# Patient Record
Sex: Female | Born: 1950
Health system: Southern US, Community
[De-identification: ages and names within clinical notes are randomized; demographics above are authoritative.]

## PROBLEM LIST (undated history)

## (undated) DIAGNOSIS — F419 Anxiety disorder, unspecified: Secondary | ICD-10-CM

## (undated) DIAGNOSIS — Z8669 Personal history of other diseases of the nervous system and sense organs: Secondary | ICD-10-CM

## (undated) DIAGNOSIS — R011 Cardiac murmur, unspecified: Secondary | ICD-10-CM

## (undated) DIAGNOSIS — H269 Unspecified cataract: Secondary | ICD-10-CM

## (undated) HISTORY — DX: Personal history of other diseases of the nervous system and sense organs: Z86.69

## (undated) HISTORY — PX: TUBAL LIGATION: SHX77

## (undated) HISTORY — PX: CATARACT EXTRACTION: SUR2

## (undated) HISTORY — DX: Unspecified cataract: H26.9

## (undated) HISTORY — DX: Cardiac murmur, unspecified: R01.1

## (undated) HISTORY — PX: WISDOM TOOTH EXTRACTION: SHX21

## (undated) HISTORY — DX: Anxiety disorder, unspecified: F41.9

## (undated) HISTORY — PX: OTHER SURGICAL HISTORY: SHX169

---

## 1997-12-02 ENCOUNTER — Ambulatory Visit (HOSPITAL_COMMUNITY): Admission: RE | Admit: 1997-12-02 | Discharge: 1997-12-02 | Payer: Self-pay | Admitting: Neurological Surgery

## 1999-11-30 ENCOUNTER — Encounter: Admission: RE | Admit: 1999-11-30 | Discharge: 1999-11-30 | Payer: Self-pay | Admitting: Family Medicine

## 1999-11-30 ENCOUNTER — Encounter: Payer: Self-pay | Admitting: Family Medicine

## 2001-04-25 ENCOUNTER — Encounter: Payer: Self-pay | Admitting: Neurological Surgery

## 2001-04-25 ENCOUNTER — Encounter: Admission: RE | Admit: 2001-04-25 | Discharge: 2001-04-25 | Payer: Self-pay | Admitting: Neurological Surgery

## 2001-07-17 ENCOUNTER — Encounter: Payer: Self-pay | Admitting: Family Medicine

## 2001-07-17 ENCOUNTER — Encounter: Admission: RE | Admit: 2001-07-17 | Discharge: 2001-07-17 | Payer: Self-pay | Admitting: Family Medicine

## 2002-10-15 ENCOUNTER — Encounter: Admission: RE | Admit: 2002-10-15 | Discharge: 2002-10-15 | Payer: Self-pay | Admitting: Family Medicine

## 2002-10-15 ENCOUNTER — Encounter: Payer: Self-pay | Admitting: Family Medicine

## 2004-02-24 ENCOUNTER — Encounter: Admission: RE | Admit: 2004-02-24 | Discharge: 2004-02-24 | Payer: Self-pay | Admitting: Family Medicine

## 2004-03-30 ENCOUNTER — Encounter: Admission: RE | Admit: 2004-03-30 | Discharge: 2004-03-30 | Payer: Self-pay | Admitting: Family Medicine

## 2005-06-28 ENCOUNTER — Encounter: Admission: RE | Admit: 2005-06-28 | Discharge: 2005-06-28 | Payer: Self-pay | Admitting: Family Medicine

## 2007-08-30 ENCOUNTER — Encounter: Admission: RE | Admit: 2007-08-30 | Discharge: 2007-08-30 | Payer: Self-pay | Admitting: Family

## 2008-10-29 ENCOUNTER — Encounter: Admission: RE | Admit: 2008-10-29 | Discharge: 2008-10-29 | Payer: Self-pay | Admitting: Family Medicine

## 2009-09-06 IMAGING — MG MM SCREEN MAMMOGRAM BILATERAL
5 series · 5 of 5 positions shown · non-contrast
Comparison: none

DG SCREEN MAMMOGRAM BILATERAL
Bilateral CC and MLO view(s) were taken.

DIGITAL SCREENING MAMMOGRAM WITH CAD:
There are scattered fibroglandular densities.  No masses or malignant type calcifications are 
identified.  Compared with prior studies.

[R CC (1 of 2)]
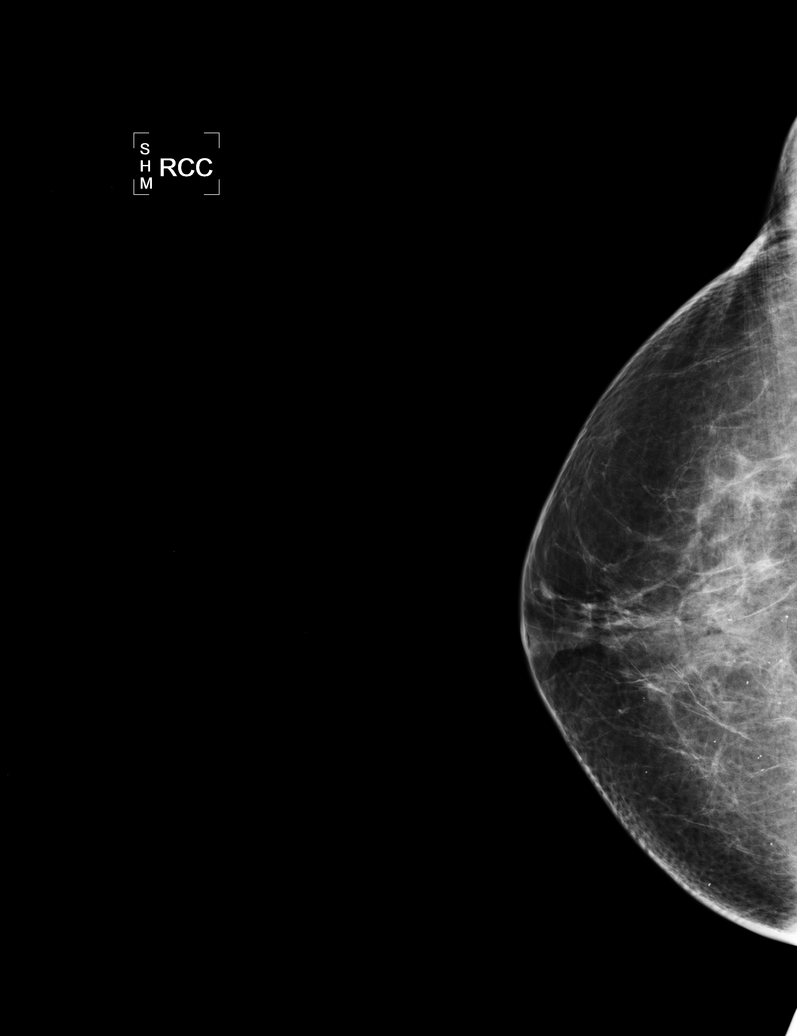

[L CC]
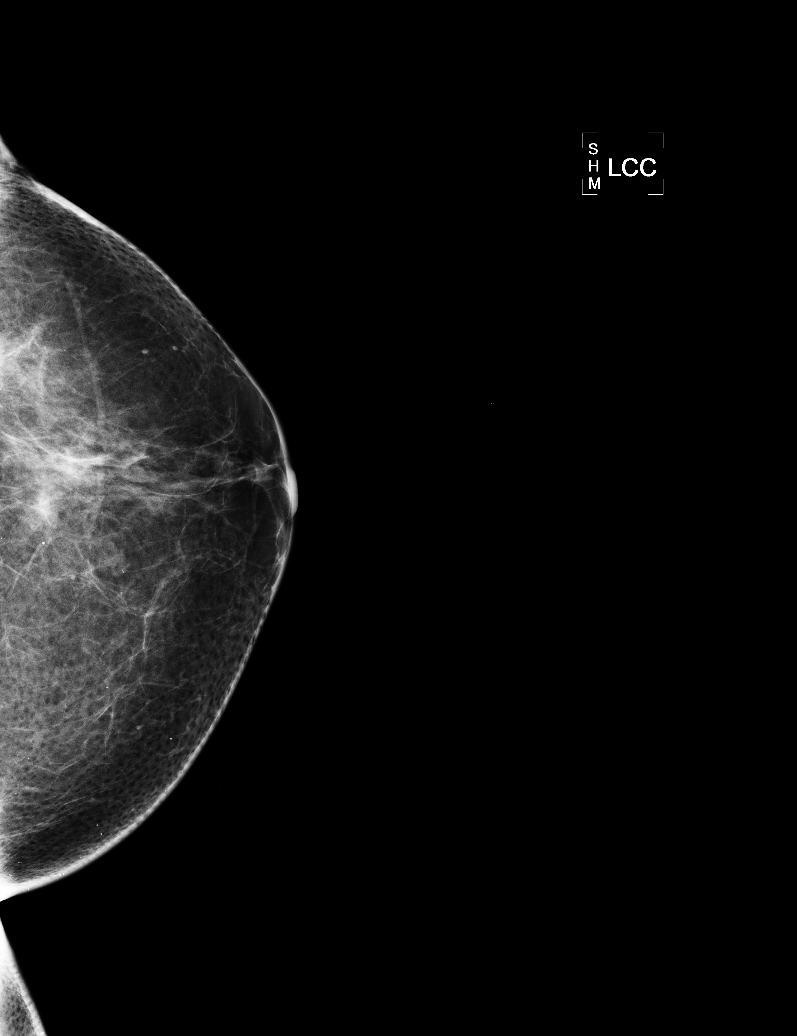

[L MLO]
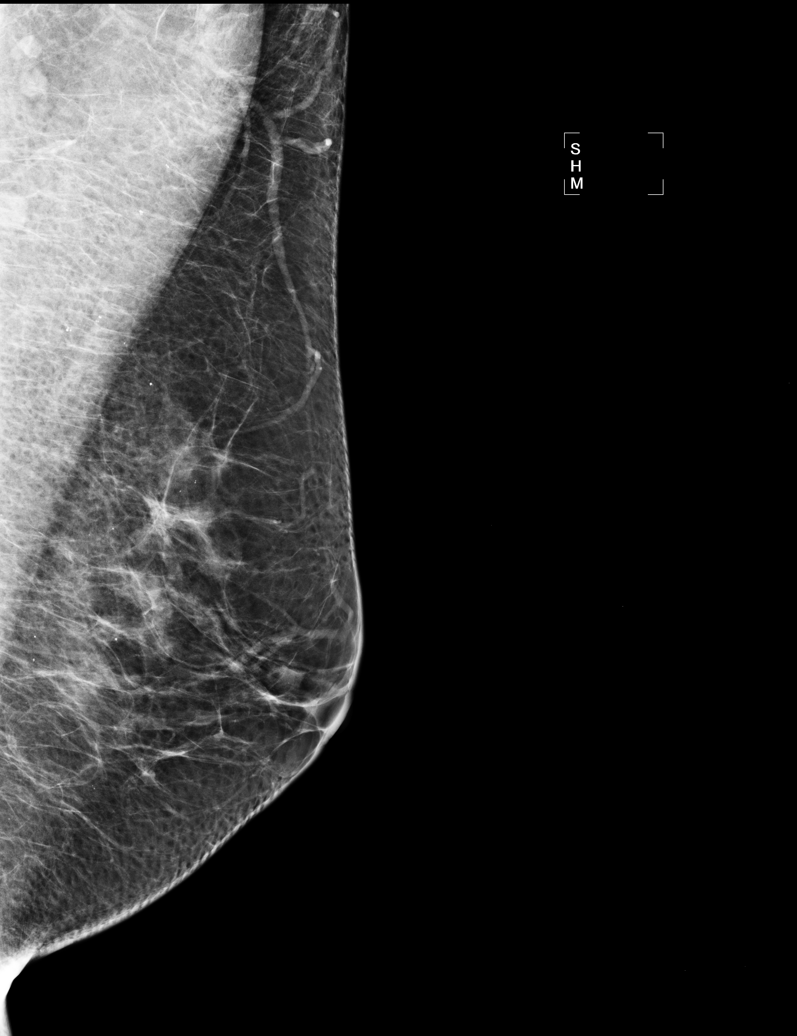

[R MLO]
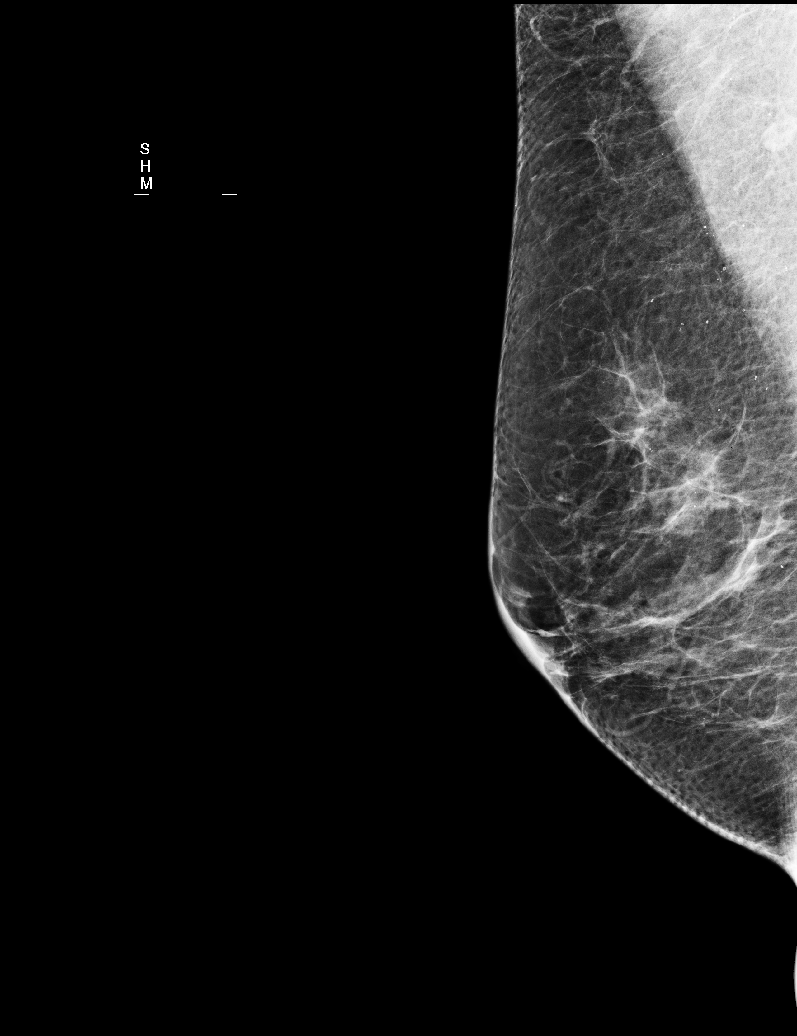

[R CC (2 of 2)]
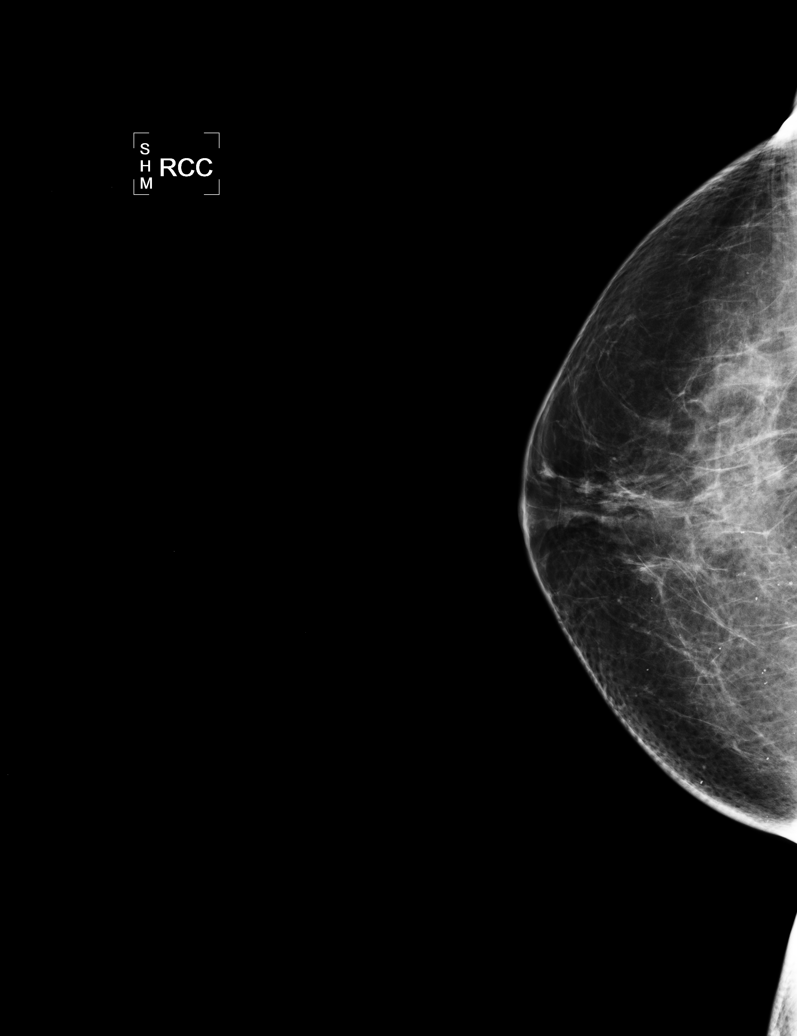

[5 of 5 positions shown; findings below may reference images not displayed]

IMPRESSION: No specific mammographic evidence of malignancy.  Next screening mammogram is recommended in one 
year.

ASSESSMENT: Negative - BI-RADS 1

Screening mammogram in 1 year.
ANALYZED BY COMPUTER AIDED DETECTION. , THIS PROCEDURE WAS A DIGITAL MAMMOGRAM.

## 2009-11-04 ENCOUNTER — Encounter: Admission: RE | Admit: 2009-11-04 | Discharge: 2009-11-04 | Payer: Self-pay | Admitting: Family Medicine

## 2010-10-04 ENCOUNTER — Other Ambulatory Visit: Payer: Self-pay | Admitting: Obstetrics & Gynecology

## 2010-10-04 DIAGNOSIS — Z1231 Encounter for screening mammogram for malignant neoplasm of breast: Secondary | ICD-10-CM

## 2010-11-06 IMAGING — MG MM SCREEN MAMMOGRAM BILATERAL
4 series · 4 of 4 positions shown · non-contrast
Comparison: none

DG SCREEN MAMMOGRAM BILATERAL
Bilateral CC and MLO view(s) were taken.

DIGITAL SCREENING MAMMOGRAM WITH CAD:
There are scattered fibroglandular densities.  No masses or malignant type calcifications are 
identified.  Compared with prior studies.

[R CC]
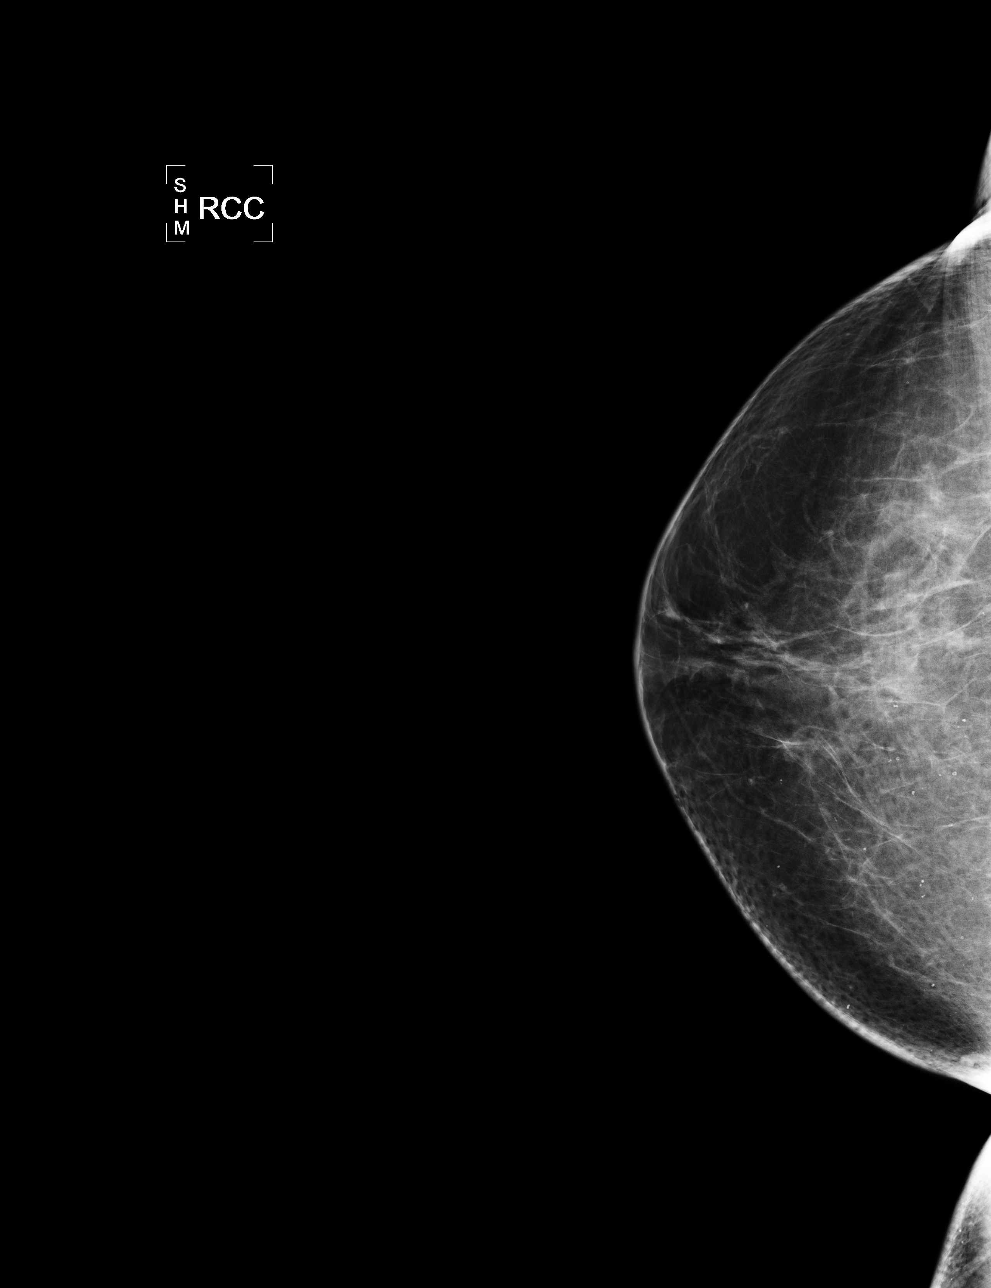

[L CC]
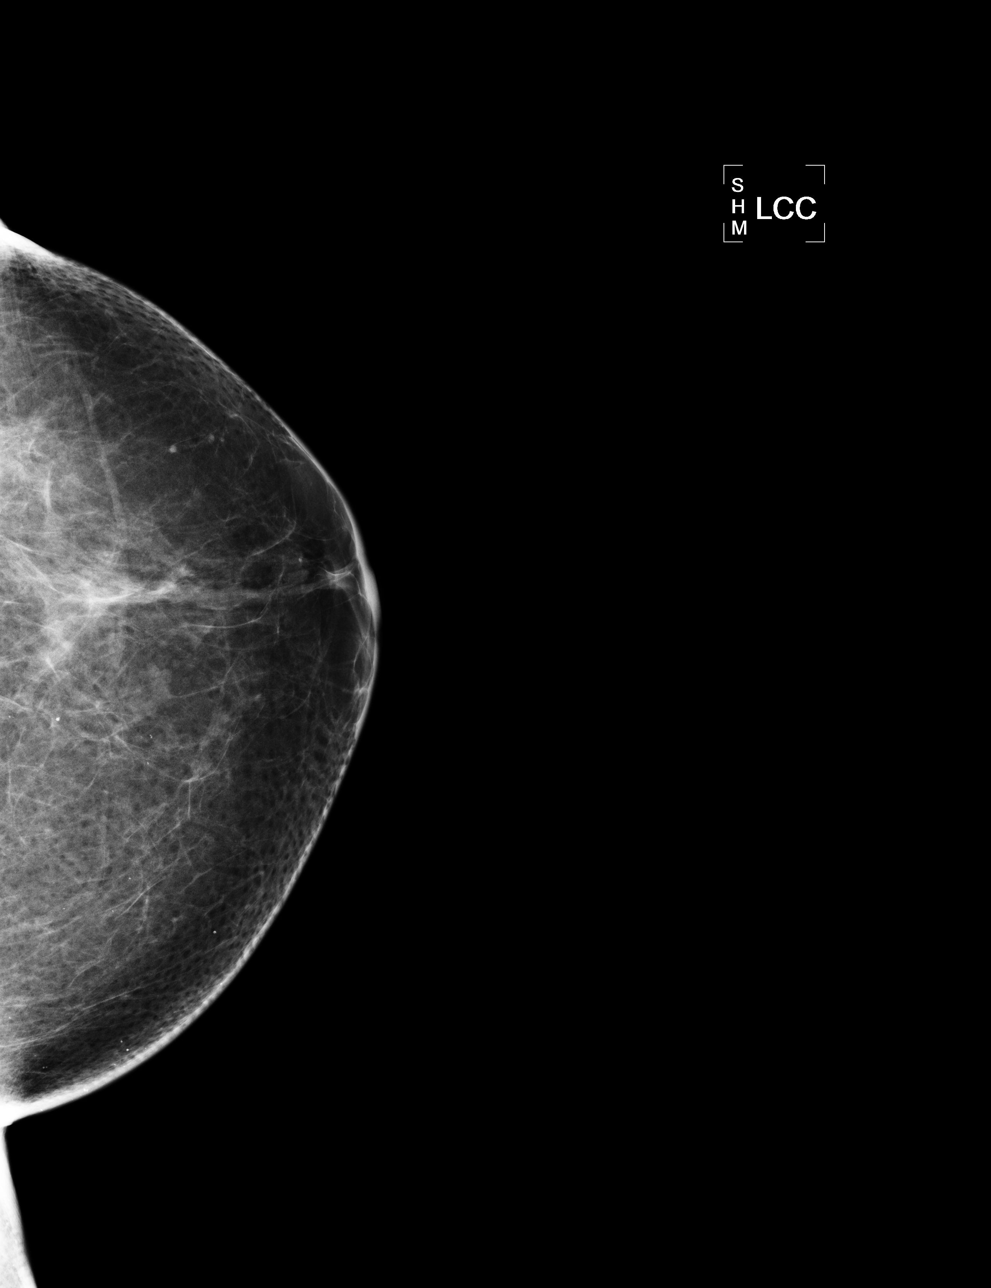

[L MLO]
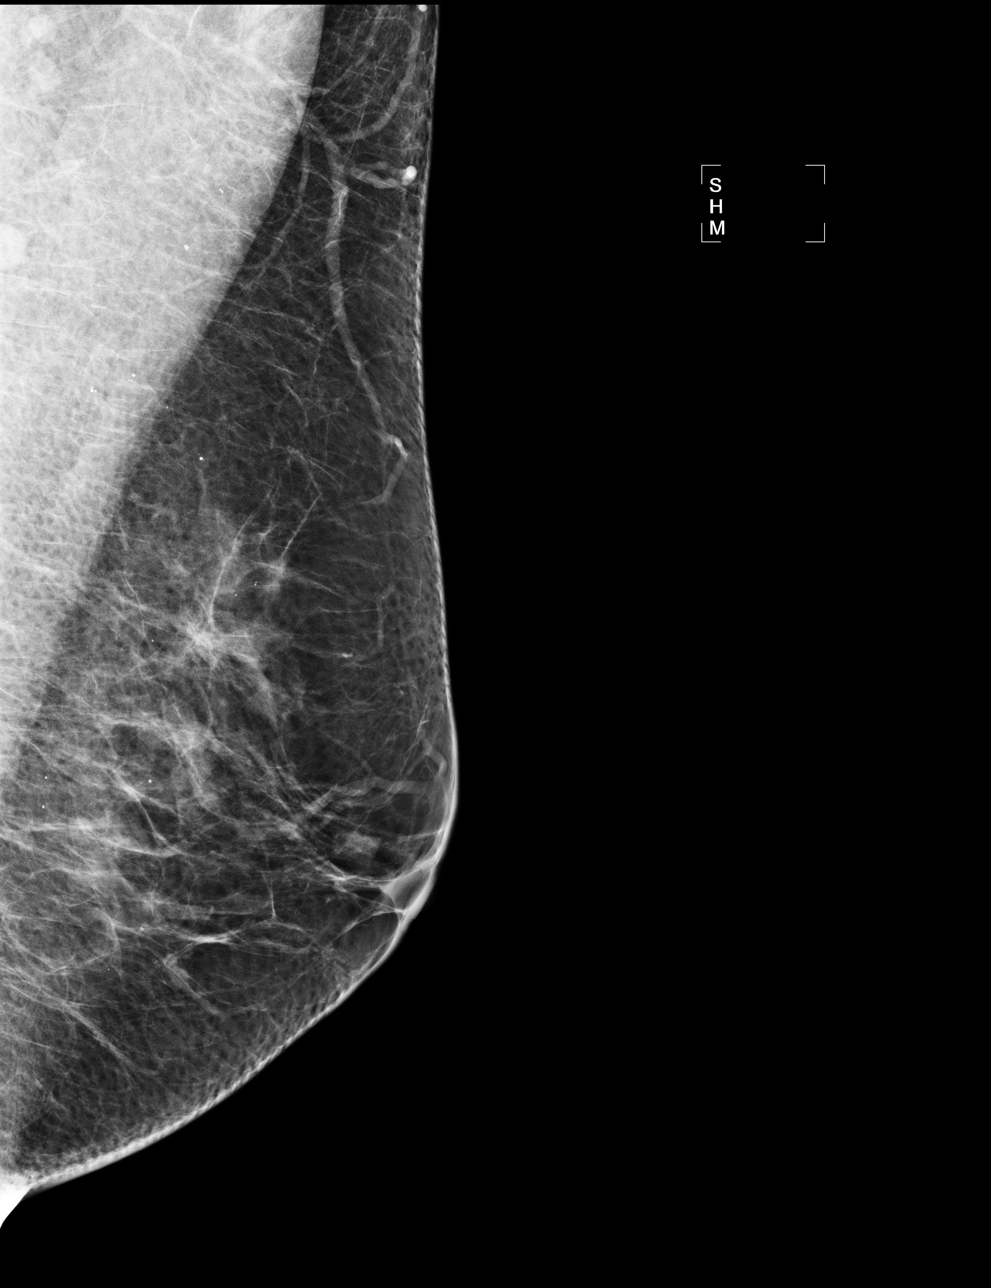

[R MLO]
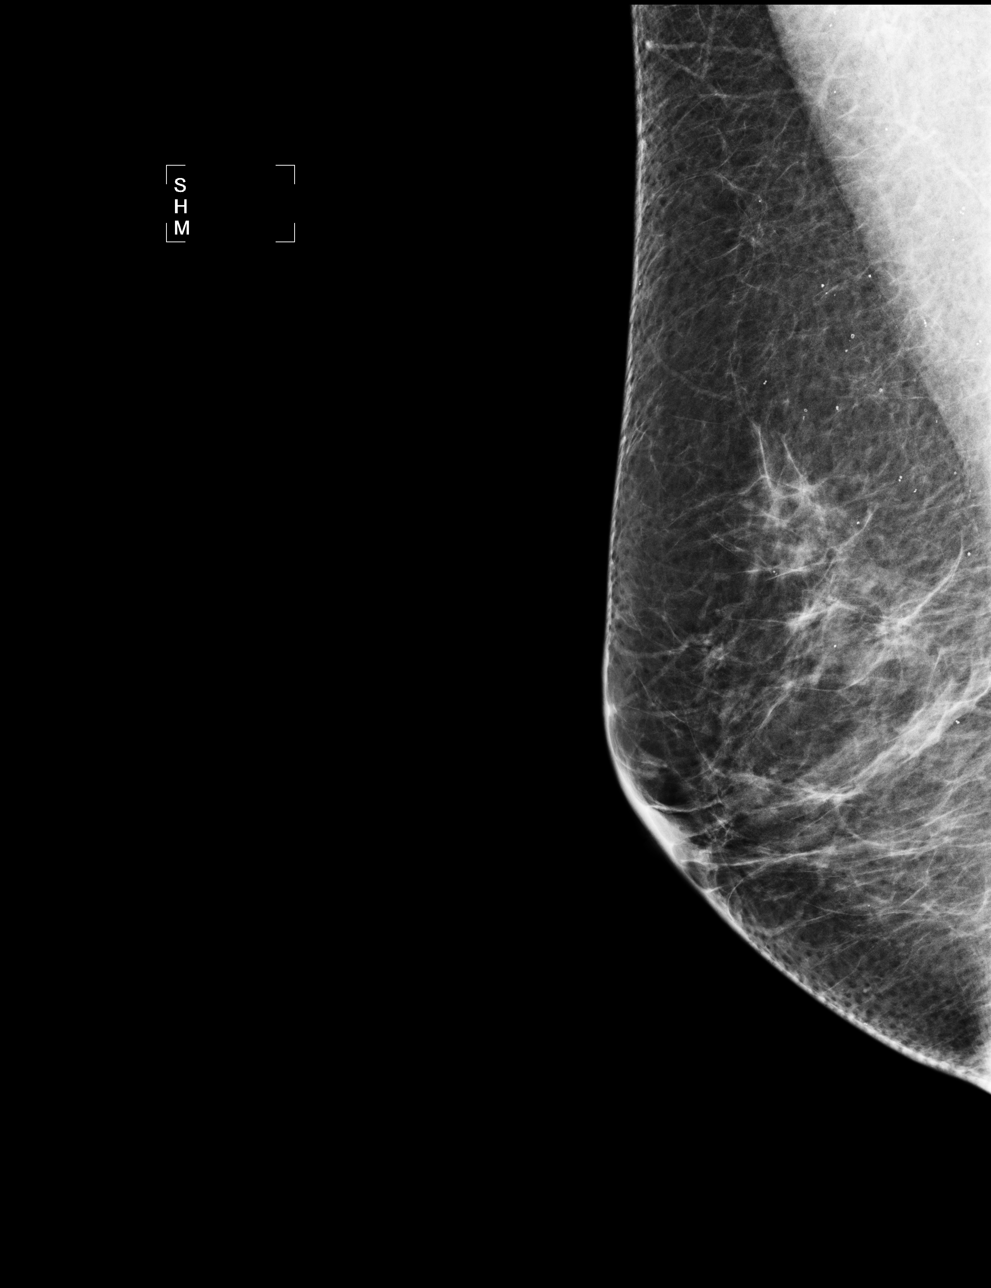

[4 of 4 positions shown; findings below may reference images not displayed]

IMPRESSION: No specific mammographic evidence of malignancy.  Next screening mammogram is recommended in one 
year.

A result letter of this screening mammogram will be mailed directly to the patient.

ASSESSMENT: Negative - BI-RADS 1

Screening mammogram in 1 year.
ANALYZED BY COMPUTER AIDED DETECTION. , THIS PROCEDURE WAS A DIGITAL MAMMOGRAM.

## 2010-11-09 ENCOUNTER — Ambulatory Visit
Admission: RE | Admit: 2010-11-09 | Discharge: 2010-11-09 | Disposition: A | Discharge status: Home / Self Care | Payer: BC Managed Care – PPO | Source: Ambulatory Visit | Attending: Obstetrics & Gynecology | Admitting: Obstetrics & Gynecology

## 2010-11-09 DIAGNOSIS — Z1231 Encounter for screening mammogram for malignant neoplasm of breast: Secondary | ICD-10-CM

## 2011-10-21 ENCOUNTER — Other Ambulatory Visit: Payer: Self-pay | Admitting: Obstetrics & Gynecology

## 2011-10-21 DIAGNOSIS — Z1231 Encounter for screening mammogram for malignant neoplasm of breast: Secondary | ICD-10-CM

## 2011-10-25 ENCOUNTER — Other Ambulatory Visit: Payer: Self-pay | Admitting: Obstetrics & Gynecology

## 2011-12-19 ENCOUNTER — Ambulatory Visit
Admission: RE | Admit: 2011-12-19 | Discharge: 2011-12-19 | Disposition: A | Discharge status: Home / Self Care | Payer: BC Managed Care – PPO | Source: Ambulatory Visit | Attending: Obstetrics & Gynecology | Admitting: Obstetrics & Gynecology

## 2011-12-19 DIAGNOSIS — Z1231 Encounter for screening mammogram for malignant neoplasm of breast: Secondary | ICD-10-CM

## 2012-11-16 IMAGING — MG MM DIGITAL SCREENING {[facility]}
1 series · 1 of 1 positions shown · non-contrast
Comparison: none

DG SCREEN MAMMOGRAM BILATERAL
Bilateral CC and MLO view(s) were taken.

DIGITAL SCREENING MAMMOGRAM WITH CAD:
There are scattered fibroglandular densities.  No masses or malignant type calcifications are 
identified.  Compared with prior studies.
Images were processed with CAD.

[L MLO]
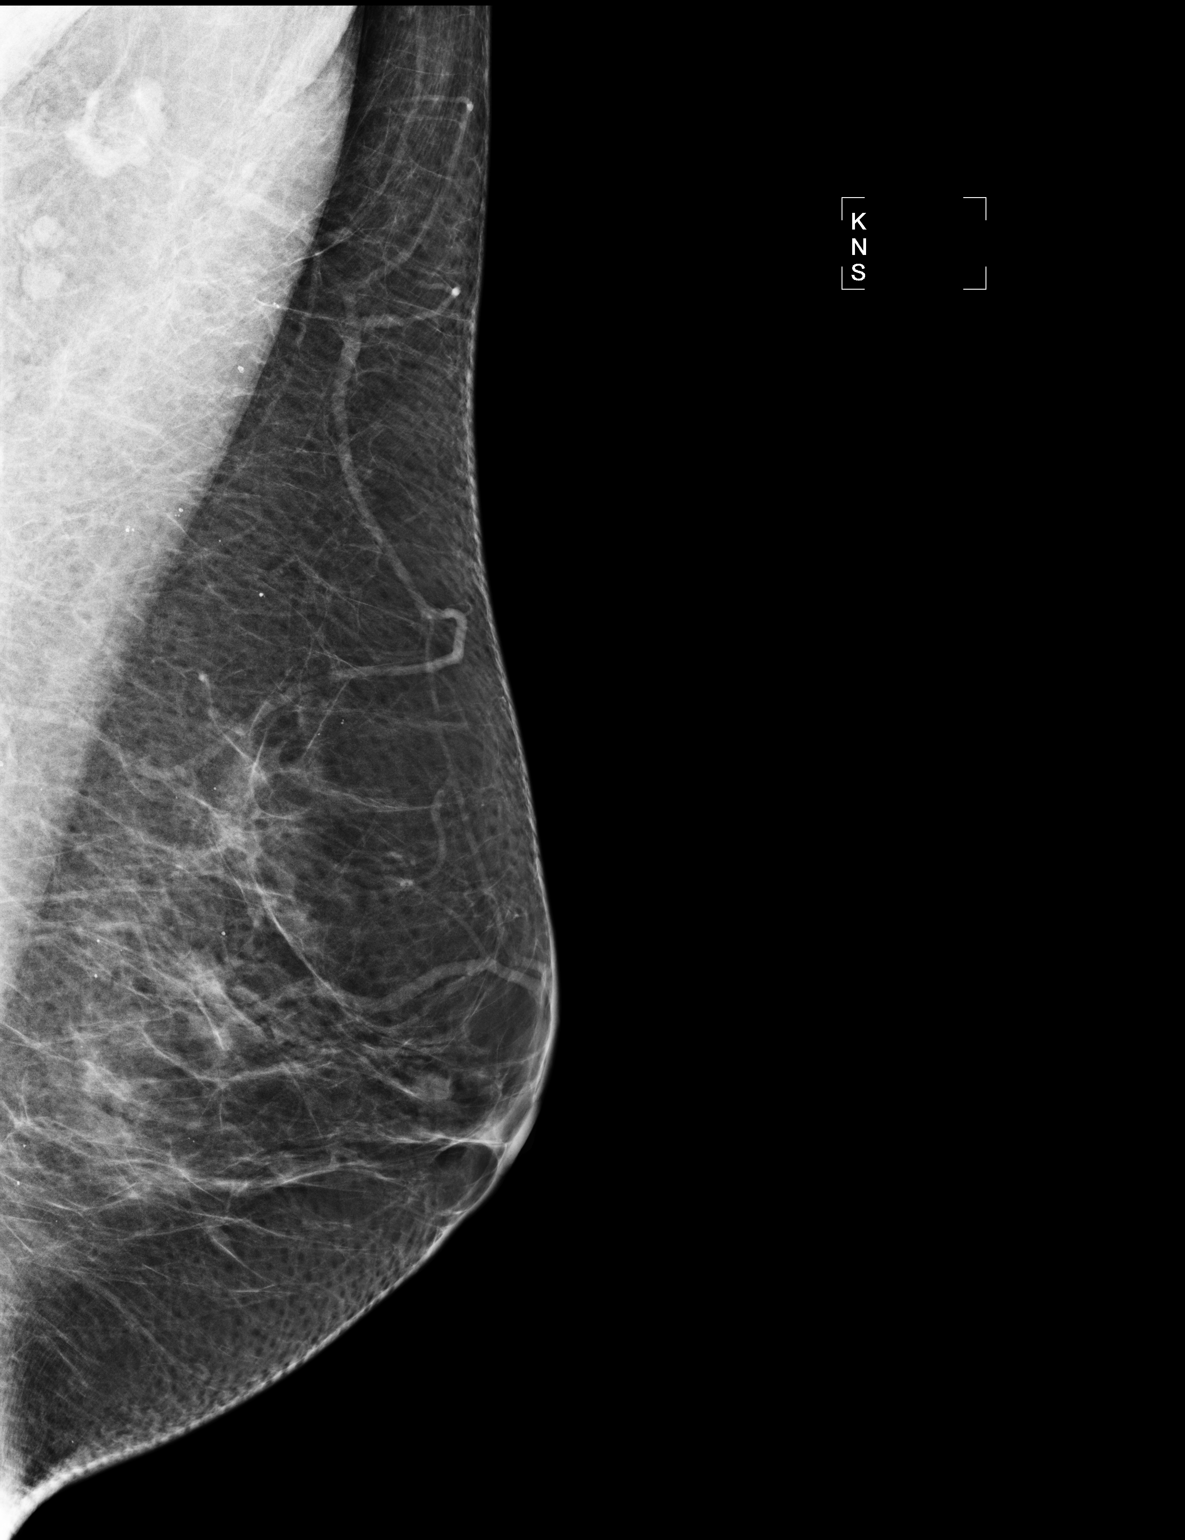

[1 of 1 positions shown; findings below may reference images not displayed]

IMPRESSION: No specific mammographic evidence of malignancy.  Next screening mammogram is recommended in one 
year.

A result letter of this screening mammogram will be mailed directly to the patient.

ASSESSMENT: Benign - BI-RADS 2

Screening mammogram in 1 year.
,

## 2013-03-08 ENCOUNTER — Other Ambulatory Visit: Payer: Self-pay

## 2013-03-08 DIAGNOSIS — Z1231 Encounter for screening mammogram for malignant neoplasm of breast: Secondary | ICD-10-CM

## 2013-03-26 ENCOUNTER — Ambulatory Visit
Admission: RE | Admit: 2013-03-26 | Discharge: 2013-03-26 | Disposition: A | Discharge status: Home / Self Care | Payer: BC Managed Care – PPO | Source: Ambulatory Visit

## 2013-03-26 DIAGNOSIS — Z1231 Encounter for screening mammogram for malignant neoplasm of breast: Secondary | ICD-10-CM

## 2014-02-27 ENCOUNTER — Other Ambulatory Visit: Payer: Self-pay

## 2014-02-27 DIAGNOSIS — Z1231 Encounter for screening mammogram for malignant neoplasm of breast: Secondary | ICD-10-CM

## 2014-03-27 ENCOUNTER — Ambulatory Visit
Admission: RE | Admit: 2014-03-27 | Discharge: 2014-03-27 | Disposition: A | Discharge status: Home / Self Care | Payer: BC Managed Care – PPO | Source: Ambulatory Visit

## 2014-03-27 DIAGNOSIS — Z1231 Encounter for screening mammogram for malignant neoplasm of breast: Secondary | ICD-10-CM

## 2014-09-03 ENCOUNTER — Other Ambulatory Visit: Payer: Self-pay

## 2014-09-03 DIAGNOSIS — Z1231 Encounter for screening mammogram for malignant neoplasm of breast: Secondary | ICD-10-CM

## 2015-04-01 ENCOUNTER — Ambulatory Visit: Payer: Self-pay

## 2015-04-08 ENCOUNTER — Ambulatory Visit
Admission: RE | Admit: 2015-04-08 | Discharge: 2015-04-08 | Disposition: A | Discharge status: Home / Self Care | Payer: BLUE CROSS/BLUE SHIELD | Source: Ambulatory Visit

## 2015-04-08 DIAGNOSIS — Z1231 Encounter for screening mammogram for malignant neoplasm of breast: Secondary | ICD-10-CM

## 2016-03-10 ENCOUNTER — Other Ambulatory Visit: Payer: Self-pay | Admitting: Obstetrics & Gynecology

## 2016-03-10 DIAGNOSIS — Z1231 Encounter for screening mammogram for malignant neoplasm of breast: Secondary | ICD-10-CM

## 2016-04-11 ENCOUNTER — Ambulatory Visit
Admission: RE | Admit: 2016-04-11 | Discharge: 2016-04-11 | Disposition: A | Discharge status: Home / Self Care | Payer: BLUE CROSS/BLUE SHIELD | Source: Ambulatory Visit | Attending: Obstetrics & Gynecology | Admitting: Obstetrics & Gynecology

## 2016-04-11 DIAGNOSIS — Z1231 Encounter for screening mammogram for malignant neoplasm of breast: Secondary | ICD-10-CM

## 2017-03-07 ENCOUNTER — Other Ambulatory Visit: Payer: Self-pay | Admitting: Obstetrics & Gynecology

## 2017-03-07 DIAGNOSIS — Z1231 Encounter for screening mammogram for malignant neoplasm of breast: Secondary | ICD-10-CM

## 2017-04-14 ENCOUNTER — Ambulatory Visit: Payer: BLUE CROSS/BLUE SHIELD

## 2017-05-03 ENCOUNTER — Ambulatory Visit
Admission: RE | Admit: 2017-05-03 | Discharge: 2017-05-03 | Disposition: A | Discharge status: Home / Self Care | Payer: BLUE CROSS/BLUE SHIELD | Source: Ambulatory Visit | Attending: Obstetrics & Gynecology | Admitting: Obstetrics & Gynecology

## 2017-05-03 DIAGNOSIS — Z1231 Encounter for screening mammogram for malignant neoplasm of breast: Secondary | ICD-10-CM

## 2017-05-05 ENCOUNTER — Other Ambulatory Visit: Payer: Self-pay | Admitting: Obstetrics & Gynecology

## 2017-05-05 DIAGNOSIS — E2839 Other primary ovarian failure: Secondary | ICD-10-CM

## 2018-04-03 ENCOUNTER — Ambulatory Visit (INDEPENDENT_AMBULATORY_CARE_PROVIDER_SITE_OTHER): Payer: BLUE CROSS/BLUE SHIELD | Admitting: Obstetrics & Gynecology

## 2018-04-03 ENCOUNTER — Encounter: Payer: Self-pay | Admitting: Obstetrics & Gynecology

## 2018-04-03 VITALS — BP 120/70 | Ht 63.5 in | Wt 131.0 lb

## 2018-04-03 DIAGNOSIS — N8110 Cystocele, unspecified: Secondary | ICD-10-CM

## 2018-04-03 NOTE — Patient Instructions (Signed)
1. Baden-Walker grade 4 cystocele Progressively more symptomatic cystocele, now grade 4/4, but still not interfering with daily activities.  Feels the bulge when physically active or pushing for bowel movement, but not experiencing pain.  Occasional partial emptying of the bladder but no retention.  No significant urinary incontinence.  Management of cystocele discussed with patient.  Usage, risks and benefits of pessaries reviewed.  Surgical approaches discussed with patient including cystocele repair and sling procedure.  The risk of urinary incontinence after cystocele repair reviewed.  The risk of relapse especially after 10 years post surgery.  Patient will increase her pelvic floor exercises including the Kegels and the extra ones recommended by PT.  She will avoid overfilling her bladder and avoid pelvic pressure.  She will observe at this time and call back for reevaluation if progression of symptoms justify a pessary or a surgical correction.  Follow-up for annual gynecologic exam in October 2019.  Jacqueline Garrett, it was a pleasure seeing you today

## 2018-04-03 NOTE — Progress Notes (Signed)
    Jacqueline Garrett 1950-10-24 [id number]        67 y.o.  G2P2L2 Married  RP:  Increased bulging in vagina  HPI: Known cystocele for many years.  Slow progression over the past 2 years, now feeling a bulge at the vulva when pushing for bowel movements or when standing for prolonged periods.  Patient has been doing Kegel exercises and other exercises recommended by the physical therapist to strengthen her pelvic floor.  She has been using an estrogen cream vaginally for the last 2 years.  No stress urinary incontinence.  No urgency.  Sometimes feels that her bladder does not empty completely and needs to return to the bathroom shortly after.  Normal bowel movements.  No difficulty or pain with intercourse.  Not engaged in strenuous physical activities.   OB History  Gravida Para Term Preterm AB Living  2 2       2   SAB TAB Ectopic Multiple Live Births               # Outcome Date GA Lbr Len/2nd Weight Sex Delivery Anes PTL Lv  2 Para           1 Para             Past medical history,surgical history, problem list, medications, allergies, family history and social history were all reviewed and documented in the EPIC chart.   Directed ROS with pertinent positives and negatives documented in the history of present illness/assessment and plan.  Exam:  Vitals:   04/03/18 1128  BP: 120/70  Weight: 131 lb (59.4 kg)  Height: 5' 3.5" (1.613 m)   General appearance:  Normal  Abdomen: Normal  Gynecologic exam: Vulva normal.  Bimanual exam in laying down position: Uterus anteverted, normal volume, mobile, NT.   No adnexal mass felt, nontender bilaterally.  With Valsalva, cystocele grade 2 out of 4.  Minimal rectocele.  No significant uterine prolapse.  In standing position with Valsalva: Cystocele grade 4 out of 4, just visible at the vulva.  Minimal cystocele.  No significant uterine prolapse.   Assessment/Plan:  67 y.o. G2P2   1. Baden-Walker grade 4 cystocele Progressively  more symptomatic cystocele, now grade 4/4, but still not interfering with daily activities.  Feels the bulge when physically active or pushing for bowel movement, but not experiencing pain.  Occasional partial emptying of the bladder but no retention.  No significant urinary incontinence.  Management of cystocele discussed with patient.  Usage, risks and benefits of pessaries reviewed.  Surgical approaches discussed with patient including cystocele repair and sling procedure.  The risk of urinary incontinence after cystocele repair reviewed.  The risk of relapse especially after 10 years post surgery.  Patient will increase her pelvic floor exercises including the Kegels and the extra ones recommended by PT.  She will avoid overfilling her bladder and avoid pelvic pressure.  She will observe at this time and call back for reevaluation if progression of symptoms justify a pessary or a surgical correction.  Follow-up for annual gynecologic exam in October 2019.  Counseling on above issues and coordination of care more than 50% for 25 minutes.  Princess Bruins MD, 11:34 AM 04/03/2018

## 2018-04-23 ENCOUNTER — Other Ambulatory Visit: Payer: Self-pay | Admitting: Obstetrics & Gynecology

## 2018-04-23 DIAGNOSIS — Z1231 Encounter for screening mammogram for malignant neoplasm of breast: Secondary | ICD-10-CM

## 2018-04-23 DIAGNOSIS — E2839 Other primary ovarian failure: Secondary | ICD-10-CM

## 2018-06-05 ENCOUNTER — Encounter: Payer: Self-pay | Admitting: Obstetrics & Gynecology

## 2018-06-05 ENCOUNTER — Ambulatory Visit (INDEPENDENT_AMBULATORY_CARE_PROVIDER_SITE_OTHER): Payer: BLUE CROSS/BLUE SHIELD | Admitting: Obstetrics & Gynecology

## 2018-06-05 VITALS — BP 132/76 | Ht 63.25 in | Wt 133.0 lb

## 2018-06-05 DIAGNOSIS — N952 Postmenopausal atrophic vaginitis: Secondary | ICD-10-CM

## 2018-06-05 DIAGNOSIS — Z01419 Encounter for gynecological examination (general) (routine) without abnormal findings: Secondary | ICD-10-CM | POA: Diagnosis not present

## 2018-06-05 DIAGNOSIS — Z13820 Encounter for screening for osteoporosis: Secondary | ICD-10-CM | POA: Diagnosis not present

## 2018-06-05 DIAGNOSIS — Z780 Asymptomatic menopausal state: Secondary | ICD-10-CM

## 2018-06-05 DIAGNOSIS — N8110 Cystocele, unspecified: Secondary | ICD-10-CM

## 2018-06-05 MED ORDER — ESTRADIOL 0.1 MG/GM VA CREA: 0.2500 | TOPICAL_CREAM | VAGINAL | 4 refills | Status: DC

## 2018-06-05 NOTE — Progress Notes (Signed)
Jacqueline Garrett December 10, 1950 [id number]   History:    67 y.o. G2P2L2 Married  RP:  Established patient presenting for annual gyn exam   HPI: Menopause, well on no hormone replacement therapy.  No postmenopausal bleeding.  No pelvic pain.  Symptomatic cystocele with increased feeling of bulging especially when active and with bowel movements.  Has done Kegel exercises and physical therapy with some improvement.  No stress urinary incontinence or urgency.  Occasionally some difficulty emptying her bladder completely, has to push with her fingers.  Would like to try a pessary.  Using estrogen cream vaginally.  No pain with intercourse.  Bowel movements normal.  Breasts normal.  Will follow-up fasting for health labs..  Past medical history,surgical history, family history and social history were all reviewed and documented in the EPIC chart.  Gynecologic History No LMP recorded. Patient is postmenopausal. Contraception: post menopausal status Last Pap: 04/2017. Results were: Negative/HPV HR neg Last mammogram: 04/2017. Results were: Negative Bone Density: 2013.  Will schedule here now Colonoscopy: Never.  Refer to [place].  Obstetric History OB History  Gravida Para Term Preterm AB Living  '2 2       2  ' SAB TAB Ectopic Multiple Live Births               # Outcome Date GA Lbr Len/2nd Weight Sex Delivery Anes PTL Lv  2 Para           1 Para              ROS: A ROS was performed and pertinent positives and negatives are included in the history.  GENERAL: No fevers or chills. HEENT: No change in vision, no earache, sore throat or sinus congestion. NECK: No pain or stiffness. CARDIOVASCULAR: No chest pain or pressure. No palpitations. PULMONARY: No shortness of breath, cough or wheeze. GASTROINTESTINAL: No abdominal pain, nausea, vomiting or diarrhea, melena or bright red blood per rectum. GENITOURINARY: No urinary frequency, urgency, hesitancy or dysuria. MUSCULOSKELETAL: No joint or muscle  pain, no back pain, no recent trauma. DERMATOLOGIC: No rash, no itching, no lesions. ENDOCRINE: No polyuria, polydipsia, no heat or cold intolerance. No recent change in weight. HEMATOLOGICAL: No anemia or easy bruising or bleeding. NEUROLOGIC: No headache, seizures, numbness, tingling or weakness. PSYCHIATRIC: No depression, no loss of interest in normal activity or change in sleep pattern.     Exam:   BP 132/76   Ht 5' 3.25" (1.607 m)   Wt 133 lb (60.3 kg)   BMI 23.37 kg/m   Body mass index is 23.37 kg/m.  General appearance : Well developed well nourished female. No acute distress HEENT: Eyes: no retinal hemorrhage or exudates,  Neck supple, trachea midline, no carotid bruits, no thyroidmegaly Lungs: Clear to auscultation, no rhonchi or wheezes, or rib retractions  Heart: Regular rate and rhythm, no murmurs or gallops Breast:Examined in sitting and supine position were symmetrical in appearance, no palpable masses or tenderness,  no skin retraction, no nipple inversion, no nipple discharge, no skin discoloration, no axillary or supraclavicular lymphadenopathy Abdomen: no palpable masses or tenderness, no rebound or guarding Extremities: no edema or skin discoloration or tenderness  Pelvic: Vulva: Normal             Vagina: No gross lesions or discharge.  Cystocele grade 3/4 with Valsalva.  Cervix: No gross lesions or discharge.  Pap reflex done.  Uterus  AV, normal size, shape and consistency, non-tender and mobile  Adnexa  Without masses  or tenderness  Anus: Normal   Assessment/Plan:  67 y.o. female for annual exam   1. Well female exam with routine gynecological exam Gynecologic exam in menopause with cystocele.  Pap reflex done today.  Breast exam normal.  Will schedule screening mammogram now.  Good body mass index at 23.37.  Referred to [facility] for Screening Colono/Cologard.  Fasting health labs here. - CBC; Future - Comp Met (CMET); Future - TSH; Future - Lipid panel;  Future - VITAMIN D 25 Hydroxy (Vit-D Deficiency, Fractures); Future  2. Postmenopausal Well on no hormone replacement therapy.  No postmenopausal bleeding.  3. Postmenopausal atrophic vaginitis Doing well with Estrace cream vaginally.  No contraindication.  Estrace cream represcribed.  4. Screening for osteoporosis Vitamin D supplements, calcium intake of 1.5 g/day and regular weightbearing physical activity recommended.  Follow-up here for bone density. - DG Bone Density; Future  5. Baden-Walker grade 3 cystocele Symptomatic grade 3/4 cystocele.  Interested in trying a pessary.  Usage, risks and benefits of pessary reviewed with patient.  Follow-up for fitting of pessary.  Other orders - estradiol (ESTRACE) 0.1 MG/GM vaginal cream; Place 4.54 Applicatorfuls vaginally 2 (two) times a week.  Counseling on above issues and coordination of care more than 50% for 10 minutes.  Princess Bruins MD, 2:06 PM 06/05/2018

## 2018-06-07 ENCOUNTER — Telehealth: Payer: Self-pay | Admitting: *Deleted

## 2018-06-07 NOTE — Telephone Encounter (Signed)
Spoke with patient gave her Jacqueline Garrett [phone] or [facility] [phone] to call and schedule.

## 2018-06-07 NOTE — Telephone Encounter (Signed)
-----   Message from Princess Bruins, MD sent at 06/05/2018  2:30 PM EDT ----- Regarding: Refer to [facility] Never had a screening Colonoscopy.

## 2018-06-08 ENCOUNTER — Encounter: Payer: Self-pay | Admitting: Obstetrics & Gynecology

## 2018-06-08 NOTE — Patient Instructions (Signed)
1. Well female exam with routine gynecological exam Gynecologic exam in menopause with cystocele.  Pap reflex done today.  Breast exam normal.  Will schedule screening mammogram now.  Good body mass index at 23.37.  Referred to [facility] for Screening Colono/Cologard.  Fasting health labs here. - CBC; Future - Comp Met (CMET); Future - TSH; Future - Lipid panel; Future - VITAMIN D 25 Hydroxy (Vit-D Deficiency, Fractures); Future  2. Postmenopausal Well on no hormone replacement therapy.  No postmenopausal bleeding.  3. Postmenopausal atrophic vaginitis Doing well with Estrace cream vaginally.  No contraindication.  Estrace cream represcribed.  4. Screening for osteoporosis Vitamin D supplements, calcium intake of 1.5 g/day and regular weightbearing physical activity recommended.  Follow-up here for bone density. - DG Bone Density; Future  5. Baden-Walker grade 3 cystocele Symptomatic grade 3/4 cystocele.  Interested in trying a pessary.  Usage, risks and benefits of pessary reviewed with patient.  Follow-up for fitting of pessary.  Other orders - estradiol (ESTRACE) 0.1 MG/GM vaginal cream; Place 0.15 Applicatorfuls vaginally 2 (two) times a week.  Jacqueline Garrett, it was a pleasure seeing you today!  I will inform you of your results as soon as they are available.

## 2018-06-14 ENCOUNTER — Other Ambulatory Visit: Payer: BLUE CROSS/BLUE SHIELD

## 2018-06-15 ENCOUNTER — Other Ambulatory Visit: Payer: BLUE CROSS/BLUE SHIELD

## 2018-06-21 ENCOUNTER — Other Ambulatory Visit: Payer: BLUE CROSS/BLUE SHIELD

## 2018-06-22 ENCOUNTER — Other Ambulatory Visit: Payer: BLUE CROSS/BLUE SHIELD

## 2018-06-22 ENCOUNTER — Ambulatory Visit: Payer: BLUE CROSS/BLUE SHIELD

## 2018-09-25 ENCOUNTER — Other Ambulatory Visit: Payer: BLUE CROSS/BLUE SHIELD

## 2018-09-25 ENCOUNTER — Encounter (INDEPENDENT_AMBULATORY_CARE_PROVIDER_SITE_OTHER): Payer: Self-pay

## 2018-09-25 DIAGNOSIS — Z01419 Encounter for gynecological examination (general) (routine) without abnormal findings: Secondary | ICD-10-CM

## 2018-09-26 LAB — COMPREHENSIVE METABOLIC PANEL
AG RATIO: 1.9 (calc) (ref 1.0–2.5)
ALT: 32 U/L — AB (ref 6–29)
AST: 26 U/L (ref 10–35)
Albumin: 4.4 g/dL (ref 3.6–5.1)
Alkaline phosphatase (APISO): 55 U/L (ref 37–153)
BUN: 13 mg/dL (ref 7–25)
CO2: 24 mmol/L (ref 20–32)
CREATININE: 0.76 mg/dL (ref 0.50–0.99)
Calcium: 9.2 mg/dL (ref 8.6–10.4)
Chloride: 103 mmol/L (ref 98–110)
GLOBULIN: 2.3 g/dL (ref 1.9–3.7)
GLUCOSE: 94 mg/dL (ref 65–99)
Potassium: 4.2 mmol/L (ref 3.5–5.3)
SODIUM: 138 mmol/L (ref 135–146)
TOTAL PROTEIN: 6.7 g/dL (ref 6.1–8.1)
Total Bilirubin: 0.6 mg/dL (ref 0.2–1.2)

## 2018-09-26 LAB — LIPID PANEL
Cholesterol: 210 mg/dL — ABNORMAL HIGH (ref <200)
HDL: 69 mg/dL (ref >=50)
LDL Cholesterol (Calc): 116 mg/dL (calc) — ABNORMAL HIGH
Non-HDL Cholesterol (Calc): 141 mg/dL (calc) — ABNORMAL HIGH (ref <130)
TRIGLYCERIDES: 140 mg/dL (ref <150)
Total CHOL/HDL Ratio: 3 (calc) (ref <5.0)

## 2018-09-26 LAB — CBC
HCT: 42.1 % (ref 35.0–45.0)
Hemoglobin: 14.2 g/dL (ref 11.7–15.5)
MCH: 31.7 pg (ref 27.0–33.0)
MCHC: 33.7 g/dL (ref 32.0–36.0)
MCV: 94 fL (ref 80.0–100.0)
MPV: 10.2 fL (ref 7.5–12.5)
PLATELETS: 274 10*3/uL (ref 140–400)
RBC: 4.48 10*6/uL (ref 3.80–5.10)
RDW: 12.4 % (ref 11.0–15.0)
WBC: 3.9 10*3/uL (ref 3.8–10.8)

## 2018-09-26 LAB — VITAMIN D 25 HYDROXY (VIT D DEFICIENCY, FRACTURES): Vit D, 25-Hydroxy: 29 ng/mL — ABNORMAL LOW (ref 30–100)

## 2018-09-26 LAB — TSH: TSH: 2.48 mIU/L (ref 0.40–4.50)

## 2018-09-27 ENCOUNTER — Other Ambulatory Visit: Payer: Self-pay | Admitting: Obstetrics & Gynecology

## 2018-09-27 DIAGNOSIS — R7401 Elevation of levels of liver transaminase levels: Secondary | ICD-10-CM

## 2018-09-27 DIAGNOSIS — R740 Nonspecific elevation of levels of transaminase and lactic acid dehydrogenase [LDH]: Principal | ICD-10-CM

## 2018-10-19 ENCOUNTER — Ambulatory Visit
Admission: RE | Admit: 2018-10-19 | Discharge: 2018-10-19 | Disposition: A | Discharge status: Home / Self Care | Payer: BLUE CROSS/BLUE SHIELD | Source: Ambulatory Visit | Attending: Obstetrics & Gynecology | Admitting: Obstetrics & Gynecology

## 2018-10-19 DIAGNOSIS — Z1231 Encounter for screening mammogram for malignant neoplasm of breast: Secondary | ICD-10-CM

## 2019-04-30 ENCOUNTER — Other Ambulatory Visit: Payer: Self-pay

## 2019-05-01 ENCOUNTER — Ambulatory Visit: Payer: BC Managed Care – PPO | Admitting: Obstetrics & Gynecology

## 2019-05-01 ENCOUNTER — Encounter: Payer: Self-pay | Admitting: Gynecology

## 2019-05-01 ENCOUNTER — Encounter: Payer: Self-pay | Admitting: Obstetrics & Gynecology

## 2019-05-01 VITALS — BP 130/78

## 2019-05-01 DIAGNOSIS — N8110 Cystocele, unspecified: Secondary | ICD-10-CM | POA: Diagnosis not present

## 2019-05-01 DIAGNOSIS — Z4689 Encounter for fitting and adjustment of other specified devices: Secondary | ICD-10-CM

## 2019-05-01 NOTE — Progress Notes (Signed)
    Jacqueline Garrett 1951-06-04 [id number]        68 y.o.  G2P2L2 Married  RP: Symptomatic Cystocele for Pessary fitting  HPI: Bulge at vulva when standing worsening x >1 year.  Bothersome when walking.  No SUI.  Doing Kegels regularly.  Has done Physical Therapy.  Using Estrogen cream vaginally twice a week.  Sexually active without problem.   OB History  Gravida Para Term Preterm AB Living  2 2       2   SAB TAB Ectopic Multiple Live Births               # Outcome Date GA Lbr Len/2nd Weight Sex Delivery Anes PTL Lv  2 Para           1 Para             Past medical history,surgical history, problem list, medications, allergies, family history and social history were all reviewed and documented in the EPIC chart.   Directed ROS with pertinent positives and negatives documented in the history of present illness/assessment and plan.  Exam:  Vitals:   05/01/19 1002  BP: 130/78   General appearance:  Normal  Abdomen: Normal  Gynecologic exam: Vulva normal.  Cystocele grade 4/4 with Valsalva.  No Uterine prolapse.  No Rectocele.  Fitting of a Milex ring #5 with support.     Assessment/Plan:  68 y.o. G2P2   1. Baden-Walker grade 4 cystocele Symptomatic Grade 4/4 Cystocele.  Will manage with Pessary.    2. Encounter for fitting and adjustment of pessary Milex ring #5 with support fitting well.  Will order and patient will come back for insertion.  Usage of Pessary, benefits/risks reviewed with patient.  Counseling on above issues and coordination of care >50% x 25 minutes.  Princess Bruins MD, 10:39 AM 05/01/2019

## 2019-05-01 NOTE — Patient Instructions (Signed)
1. Baden-Walker grade 4 cystocele Symptomatic Grade 4/4 Cystocele.  Will manage with Pessary.    2. Encounter for fitting and adjustment of pessary Milex ring #5 with support fitting well.  Will order and patient will come back for insertion.  Usage of Pessary, benefits/risks reviewed with patient.  Tarini, it was a pleasure seeing you today!

## 2019-05-11 IMAGING — MG DIGITAL SCREENING BILATERAL MAMMOGRAM WITH CAD
4 series · 4 of 4 positions shown · non-contrast
Comparison: Previous exam(s).

CLINICAL DATA: Screening.

EXAM:
DIGITAL SCREENING BILATERAL MAMMOGRAM WITH CAD

[L MLO]
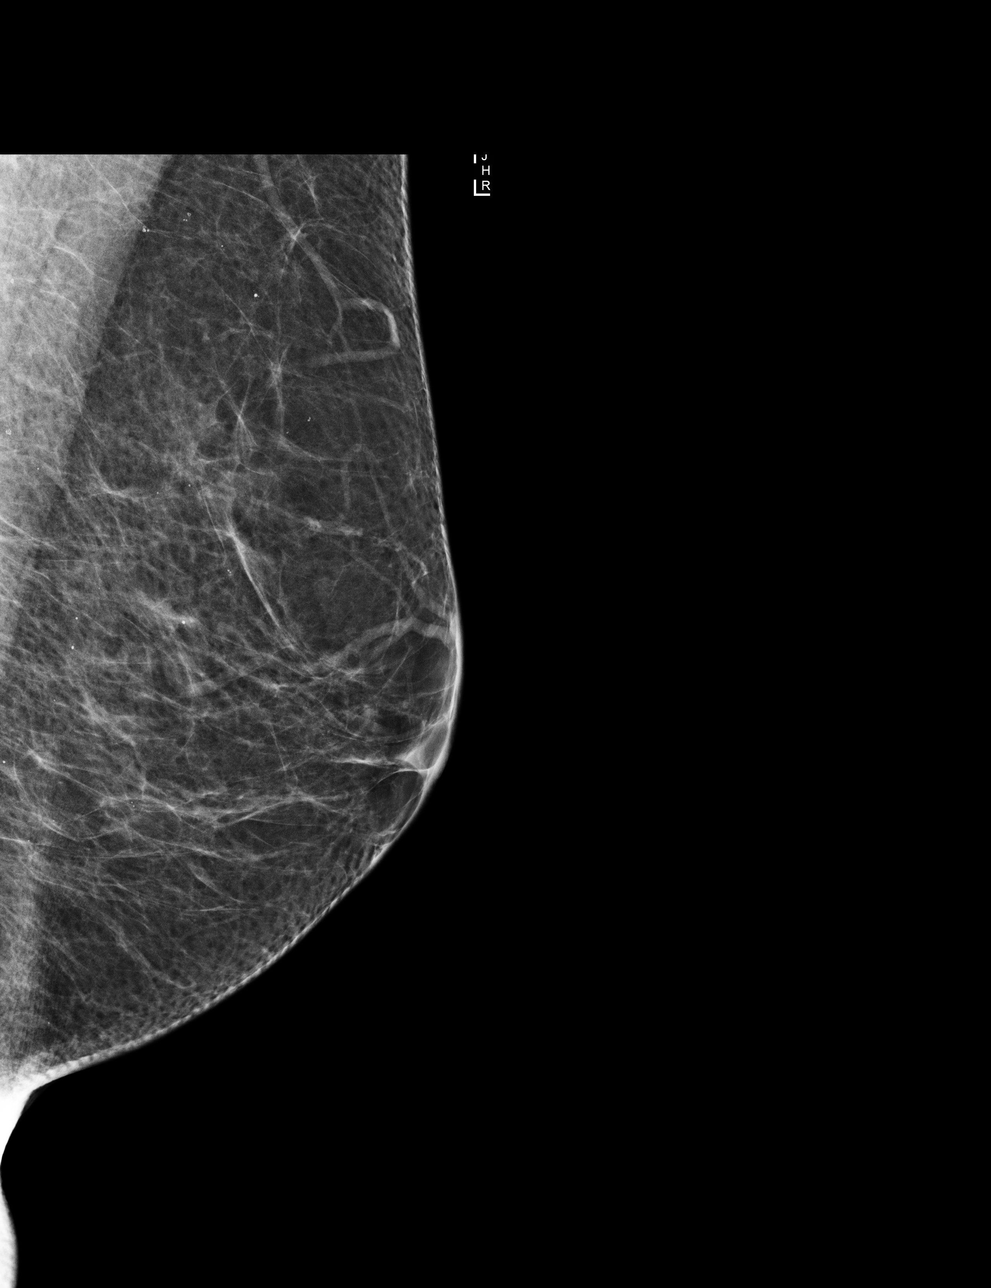

[L CC]
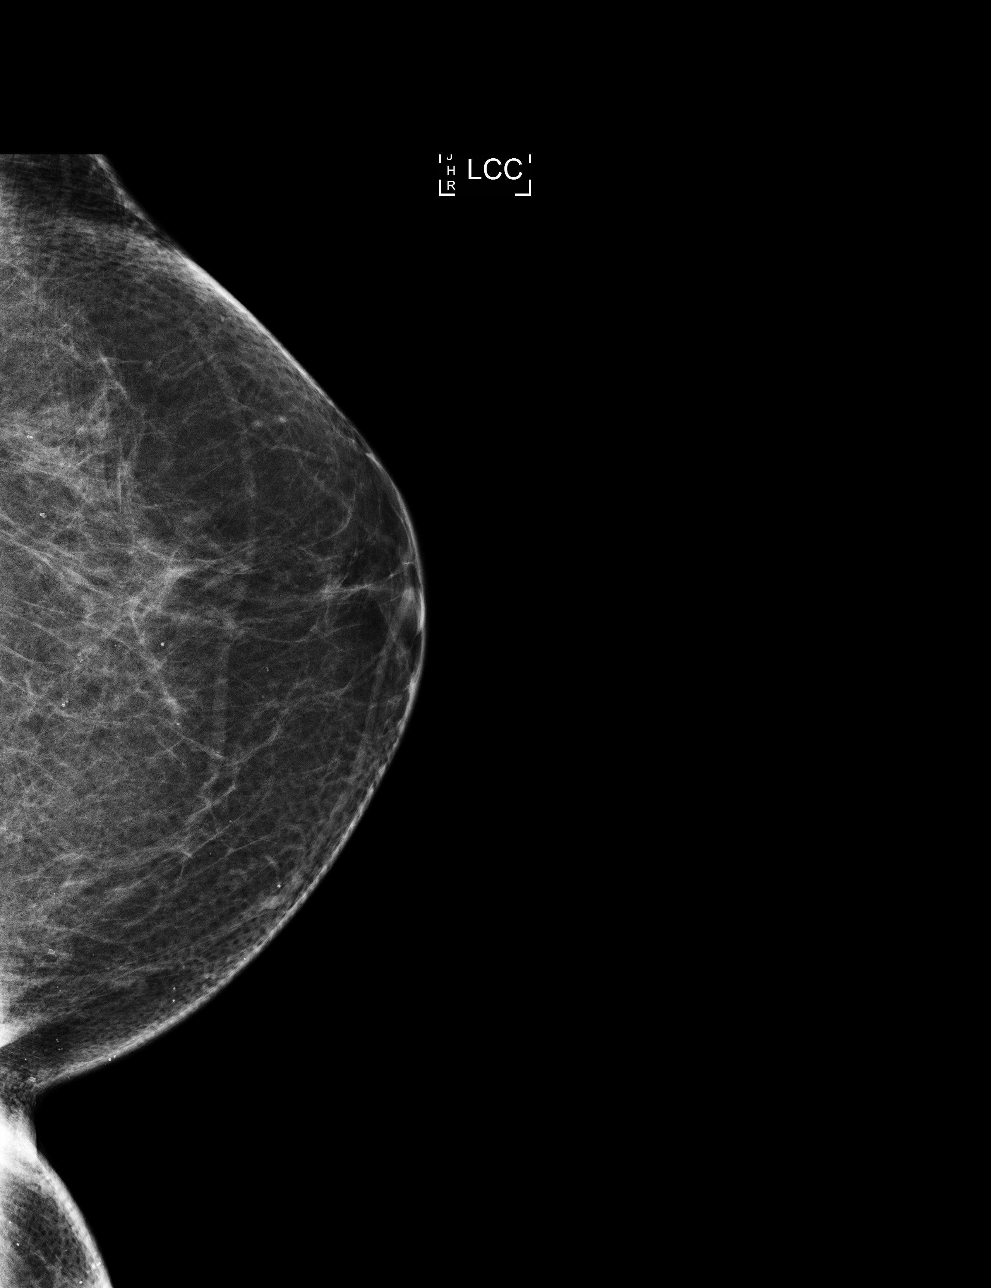

[R CC]
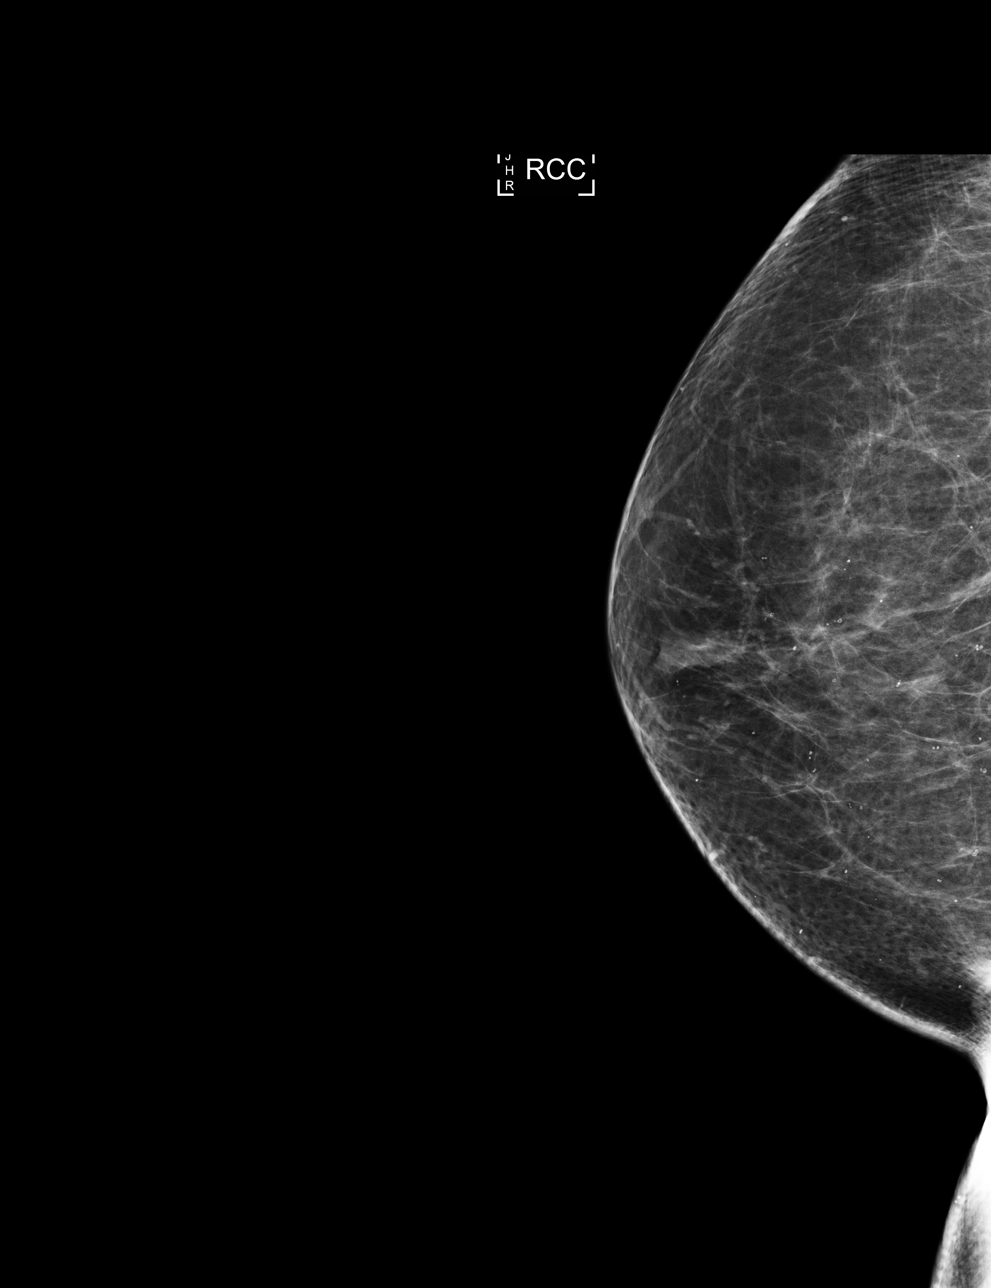

[R MLO]
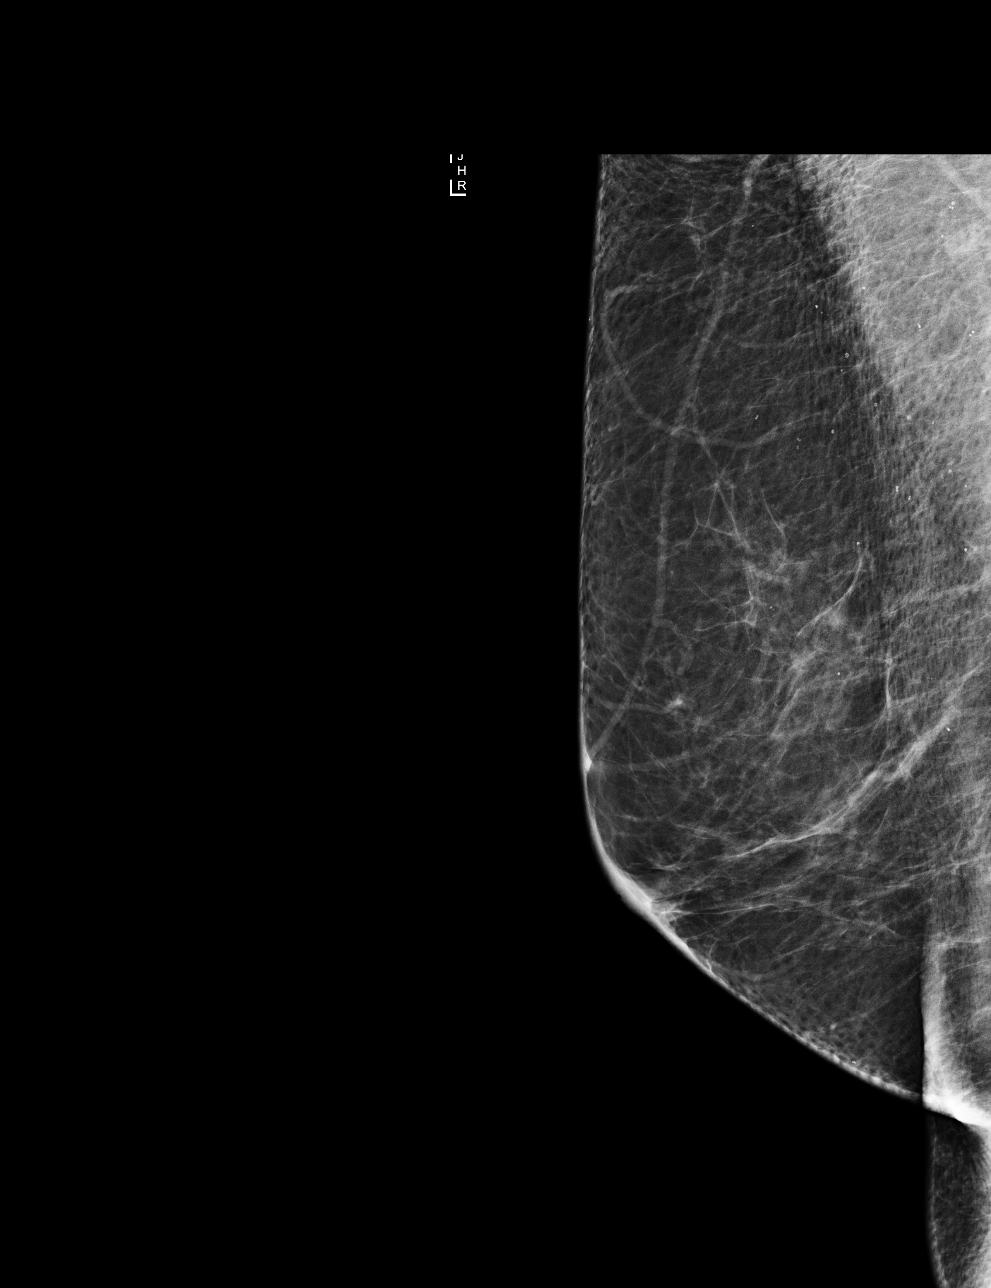

[4 of 4 positions shown; findings below may reference images not displayed]

ACR Breast Density Category b: There are scattered areas of
fibroglandular density.
FINDINGS: There are no findings suspicious for malignancy. Images were
processed with CAD.
IMPRESSION: No mammographic evidence of malignancy. A result letter of this
screening mammogram will be mailed directly to the patient.

RECOMMENDATION:
Screening mammogram in one year. (Code:AS-G-LCT)

BI-RADS CATEGORY  1: Negative.

## 2019-05-27 ENCOUNTER — Ambulatory Visit: Payer: BC Managed Care – PPO | Admitting: Obstetrics & Gynecology

## 2019-05-29 ENCOUNTER — Other Ambulatory Visit: Payer: Self-pay

## 2019-05-30 ENCOUNTER — Ambulatory Visit: Payer: BC Managed Care – PPO | Admitting: Obstetrics & Gynecology

## 2019-05-30 ENCOUNTER — Encounter: Payer: Self-pay | Admitting: Obstetrics & Gynecology

## 2019-05-30 VITALS — BP 120/78

## 2019-05-30 DIAGNOSIS — Z4689 Encounter for fitting and adjustment of other specified devices: Secondary | ICD-10-CM | POA: Diagnosis not present

## 2019-05-30 DIAGNOSIS — N8110 Cystocele, unspecified: Secondary | ICD-10-CM

## 2019-05-30 DIAGNOSIS — N812 Incomplete uterovaginal prolapse: Secondary | ICD-10-CM

## 2019-05-30 NOTE — Progress Notes (Signed)
    Jacqueline Garrett 1951/05/02 [id number]        68 y.o.  G2P2L2  RP: Pessary insertion  HPI: Symptomatic cystocele grade 4/4.  Received her Milex ring #4 with support.   OB History  Gravida Para Term Preterm AB Living  2 2       2   SAB TAB Ectopic Multiple Live Births               # Outcome Date GA Lbr Len/2nd Weight Sex Delivery Anes PTL Lv  2 Para           1 Para             Past medical history,surgical history, problem list, medications, allergies, family history and social history were all reviewed and documented in the EPIC chart.   Directed ROS with pertinent positives and negatives documented in the history of present illness/assessment and plan.  Exam:  Vitals:   05/30/19 1033  BP: 120/78   General appearance:  Normal  Abdomen: Normal  Gynecologic exam: Vulva normal.  Easy insertion of Milex ring #4 with support in the vagina.  Patient comfortable, good fit.  Not able to remove it herself.   Assessment/Plan:  69 y.o. G2P2   1. Encounter for fitting and adjustment of pessary Good fit of Milex ring #4 with support.  Easy insertion and comfortable to patient.  Patient was not able to remove the pessary herself.  2. Baden-Walker grade 4 cystocele Cystocele grade 4/4.  Management with pessary.  Follow-up in 4 weeks to evaluate the vaginal mucosa and assure that no irritation or ulceration is caused by the pessary.  Patient will also attempt again to remove and insert the pessary by herself.  Counseling on above issues and coordination of care more than 50% for 15 minutes.  Princess Bruins MD, 11:02 AM 05/30/2019

## 2019-05-31 ENCOUNTER — Telehealth: Payer: Self-pay

## 2019-05-31 NOTE — Telephone Encounter (Signed)
At pessary visit yesterday you told her she could get OTC Kyjelly for lubricant.  She said that instructions that came with pessary said to use Trimo-San Cream which it prescription.  She questioned if it is okay to use any water based otc lubricant or does she need the Rx for Trimo-San.

## 2019-05-31 NOTE — Telephone Encounter (Signed)
Recommend trying with [place], if doesn't like it, can talk about Trim-o-san at f/u visit.

## 2019-06-03 NOTE — Telephone Encounter (Signed)
Per DPR access note on file detailed message left in patient's voice mail. 

## 2019-06-07 ENCOUNTER — Encounter: Payer: Self-pay | Admitting: Obstetrics & Gynecology

## 2019-06-07 ENCOUNTER — Ambulatory Visit: Payer: BC Managed Care – PPO | Admitting: Obstetrics & Gynecology

## 2019-06-07 NOTE — Patient Instructions (Signed)
1. Encounter for fitting and adjustment of pessary Good fit of Milex ring #4 with support.  Easy insertion and comfortable to patient.  Patient was not able to remove the pessary herself.  2. Baden-Walker grade 4 cystocele Cystocele grade 4/4.  Management with pessary.  Follow-up in 4 weeks to evaluate the vaginal mucosa and assure that no irritation or ulceration is caused by the pessary.  Patient will also attempt again to remove and insert the pessary by herself.  Gracely, it was a pleasure seeing you today!

## 2019-06-24 ENCOUNTER — Encounter: Payer: Self-pay | Admitting: Obstetrics & Gynecology

## 2019-06-24 ENCOUNTER — Other Ambulatory Visit: Payer: Self-pay

## 2019-06-24 ENCOUNTER — Ambulatory Visit (INDEPENDENT_AMBULATORY_CARE_PROVIDER_SITE_OTHER): Payer: BC Managed Care – PPO | Admitting: Obstetrics & Gynecology

## 2019-06-24 VITALS — BP 128/70 | Ht 63.0 in | Wt 129.0 lb

## 2019-06-24 DIAGNOSIS — N8110 Cystocele, unspecified: Secondary | ICD-10-CM | POA: Diagnosis not present

## 2019-06-24 DIAGNOSIS — Z01419 Encounter for gynecological examination (general) (routine) without abnormal findings: Secondary | ICD-10-CM | POA: Diagnosis not present

## 2019-06-24 DIAGNOSIS — Z780 Asymptomatic menopausal state: Secondary | ICD-10-CM | POA: Diagnosis not present

## 2019-06-24 NOTE — Progress Notes (Signed)
Krislin Cui May 24, 1951 [id number]   History:    68 y.o. G2P2L2 Married  RP:  Established patient presenting for annual gyn exam   HPI: Postmenopause, well on no HRT.  No PMB.  No pelvic pain.  Well with Milex ring #5 with support for Cystocele grade 4/4.  Cleaning it herself daily.  Breasts normal.  BMI 22.85.  Walking regularly.  Will f/u here for Fasting Health Labs.  Past medical history,surgical history, family history and social history were all reviewed and documented in the EPIC chart.  Gynecologic History No LMP recorded. Patient is postmenopausal. Contraception: post menopausal status Last Pap: 04/2017. Results were: Negative/HPV HR neg Last mammogram: 10/2018. Results were: Negative Bone Density: Will check with insurance and if covered will schedule BD here. Colonoscopy: Never, declined  Obstetric History OB History  Gravida Para Term Preterm AB Living  2 2       2   SAB TAB Ectopic Multiple Live Births               # Outcome Date GA Lbr Len/2nd Weight Sex Delivery Anes PTL Lv  2 Para           1 Para              ROS: A ROS was performed and pertinent positives and negatives are included in the history.  GENERAL: No fevers or chills. HEENT: No change in vision, no earache, sore throat or sinus congestion. NECK: No pain or stiffness. CARDIOVASCULAR: No chest pain or pressure. No palpitations. PULMONARY: No shortness of breath, cough or wheeze. GASTROINTESTINAL: No abdominal pain, nausea, vomiting or diarrhea, melena or bright red blood per rectum. GENITOURINARY: No urinary frequency, urgency, hesitancy or dysuria. MUSCULOSKELETAL: No joint or muscle pain, no back pain, no recent trauma. DERMATOLOGIC: No rash, no itching, no lesions. ENDOCRINE: No polyuria, polydipsia, no heat or cold intolerance. No recent change in weight. HEMATOLOGICAL: No anemia or easy bruising or bleeding. NEUROLOGIC: No headache, seizures, numbness, tingling or weakness. PSYCHIATRIC: No  depression, no loss of interest in normal activity or change in sleep pattern.     Exam:   BP 128/70   Ht 5\' 3"  (1.6 m)   Wt 129 lb (58.5 kg)   BMI 22.85 kg/m   Body mass index is 22.85 kg/m.  General appearance : Well developed well nourished female. No acute distress HEENT: Eyes: no retinal hemorrhage or exudates,  Neck supple, trachea midline, no carotid bruits, no thyroidmegaly Lungs: Clear to auscultation, no rhonchi or wheezes, or rib retractions  Heart: Regular rate and rhythm, no murmurs or gallops Breast:Examined in sitting and supine position were symmetrical in appearance, no palpable masses or tenderness,  no skin retraction, no nipple inversion, no nipple discharge, no skin discoloration, no axillary or supraclavicular lymphadenopathy Abdomen: no palpable masses or tenderness, no rebound or guarding Extremities: no edema or skin discoloration or tenderness  Pelvic: Vulva: Normal             Vagina: No gross lesions or discharge.  Pessary removed, cleaned, put back in place after the Gyn exam.  Cystocele grade 4/4 with Valsalva.  Cervix: No gross lesions or discharge.  Pap reflex done.  Uterus  AV, normal size, shape and consistency, non-tender and mobile  Adnexa  Without masses or tenderness  Anus: Normal   Assessment/Plan:  68 y.o. female for annual exam   1. Encounter for routine gynecological examination with Papanicolaou smear of cervix Gynecologic exam with known  cystocele grade 4/4.  Pap reflex done.  Breast exam normal.  Screening mammogram March 2020 was negative.  Never did a colonoscopy, declined.  Health labs with family physician.  2. Postmenopausal Well on no hormone replacement therapy.  No postmenopausal bleeding.  Vitamin D supplements, calcium intake of 1200 mg daily and regular weightbearing physical activity is recommended.  Recommend a bone density, patient will schedule if covered by insurance.  3. Baden-Walker grade 4 cystocele Symptoms well  controlled with Milex ring pessary #5 with support.  Patient is able to remove and reinsert the pessary for maintenance.  Princess Bruins MD, 3:18 PM 06/24/2019

## 2019-06-25 LAB — PAP IG W/ RFLX HPV ASCU

## 2019-06-28 ENCOUNTER — Other Ambulatory Visit: Payer: Self-pay | Admitting: *Deleted

## 2019-06-28 ENCOUNTER — Encounter: Payer: Self-pay | Admitting: Obstetrics & Gynecology

## 2019-06-28 DIAGNOSIS — Z01419 Encounter for gynecological examination (general) (routine) without abnormal findings: Secondary | ICD-10-CM

## 2019-06-28 DIAGNOSIS — E559 Vitamin D deficiency, unspecified: Secondary | ICD-10-CM

## 2019-06-28 DIAGNOSIS — R7401 Elevation of levels of liver transaminase levels: Secondary | ICD-10-CM

## 2019-06-28 DIAGNOSIS — E7800 Pure hypercholesterolemia, unspecified: Secondary | ICD-10-CM

## 2019-06-28 NOTE — Patient Instructions (Signed)
1. Encounter for routine gynecological examination with Papanicolaou smear of cervix Gynecologic exam with known cystocele grade 4/4.  Pap reflex done.  Breast exam normal.  Screening mammogram March 2020 was negative.  Never did a colonoscopy, declined.  Health labs with family physician.  2. Postmenopausal Well on no hormone replacement therapy.  No postmenopausal bleeding.  Vitamin D supplements, calcium intake of 1200 mg daily and regular weightbearing physical activity is recommended.  Recommend a bone density, patient will schedule if covered by insurance.  3. Baden-Walker grade 4 cystocele Symptoms well controlled with Milex ring pessary #5 with support.  Patient is able to remove and reinsert the pessary for maintenance.  Jacqueline Garrett, it was a pleasure seeing you today!  I will inform you of your results as soon as they are available.

## 2019-06-28 NOTE — Progress Notes (Signed)
Orders from [facility] this week coming 07/02/19 fasting for labs Sharrie Rothman CMA

## 2019-07-02 ENCOUNTER — Other Ambulatory Visit (INDEPENDENT_AMBULATORY_CARE_PROVIDER_SITE_OTHER): Payer: BC Managed Care – PPO

## 2019-07-02 ENCOUNTER — Other Ambulatory Visit: Payer: Self-pay

## 2019-07-02 DIAGNOSIS — Z01419 Encounter for gynecological examination (general) (routine) without abnormal findings: Secondary | ICD-10-CM

## 2019-07-02 DIAGNOSIS — E559 Vitamin D deficiency, unspecified: Secondary | ICD-10-CM

## 2019-07-02 DIAGNOSIS — R7401 Elevation of levels of liver transaminase levels: Secondary | ICD-10-CM

## 2019-07-02 DIAGNOSIS — E7800 Pure hypercholesterolemia, unspecified: Secondary | ICD-10-CM

## 2019-07-09 LAB — LIPID PANEL
Cholesterol: 215 mg/dL — ABNORMAL HIGH (ref <200)
HDL: 76 mg/dL (ref >=50)
LDL Cholesterol (Calc): 120 mg/dL (calc) — ABNORMAL HIGH
Non-HDL Cholesterol (Calc): 139 mg/dL (calc) — ABNORMAL HIGH (ref <130)
Total CHOL/HDL Ratio: 2.8 (calc) (ref <5.0)
Triglycerides: 93 mg/dL (ref <150)

## 2019-07-09 LAB — COMPREHENSIVE METABOLIC PANEL
AG Ratio: 2.1 (calc) (ref 1.0–2.5)
ALT: 41 U/L — ABNORMAL HIGH (ref 6–29)
AST: 32 U/L (ref 10–35)
Albumin: 4.5 g/dL (ref 3.6–5.1)
Alkaline phosphatase (APISO): 55 U/L (ref 37–153)
BUN: 13 mg/dL (ref 7–25)
CO2: 26 mmol/L (ref 20–32)
Calcium: 9.5 mg/dL (ref 8.6–10.4)
Chloride: 103 mmol/L (ref 98–110)
Creat: 0.78 mg/dL (ref 0.50–0.99)
Globulin: 2.1 g/dL (calc) (ref 1.9–3.7)
Glucose, Bld: 100 mg/dL — ABNORMAL HIGH (ref 65–99)
Potassium: 4.2 mmol/L (ref 3.5–5.3)
Sodium: 139 mmol/L (ref 135–146)
Total Bilirubin: 0.8 mg/dL (ref 0.2–1.2)
Total Protein: 6.6 g/dL (ref 6.1–8.1)

## 2019-07-09 LAB — CBC WITH DIFFERENTIAL/PLATELET
Absolute Monocytes: 413 cells/uL (ref 200–950)
Basophils Absolute: 52 cells/uL (ref 0–200)
Basophils Relative: 1.2 %
Eosinophils Absolute: 82 cells/uL (ref 15–500)
Eosinophils Relative: 1.9 %
HCT: 43.9 % (ref 35.0–45.0)
Hemoglobin: 14.8 g/dL (ref 11.7–15.5)
Lymphs Abs: 1187 cells/uL (ref 850–3900)
MCH: 32.2 pg (ref 27.0–33.0)
MCHC: 33.7 g/dL (ref 32.0–36.0)
MCV: 95.6 fL (ref 80.0–100.0)
MPV: 10.8 fL (ref 7.5–12.5)
Monocytes Relative: 9.6 %
Neutro Abs: 2567 cells/uL (ref 1500–7800)
Neutrophils Relative %: 59.7 %
Platelets: 240 10*3/uL (ref 140–400)
RBC: 4.59 10*6/uL (ref 3.80–5.10)
RDW: 12.9 % (ref 11.0–15.0)
Total Lymphocyte: 27.6 %
WBC: 4.3 10*3/uL (ref 3.8–10.8)

## 2019-07-09 LAB — TEST AUTHORIZATION

## 2019-07-09 LAB — VITAMIN D 25 HYDROXY (VIT D DEFICIENCY, FRACTURES): Vit D, 25-Hydroxy: 35 ng/mL (ref 30–100)

## 2019-07-09 LAB — HEMOGLOBIN A1C W/OUT EAG: Hgb A1c MFr Bld: 5.3 % of total Hgb (ref <5.7)

## 2019-09-09 ENCOUNTER — Telehealth: Payer: Self-pay

## 2019-09-09 NOTE — Telephone Encounter (Signed)
Called requesting female GI MD for screening colonoscopy. Patient was advised that [facility] has 2. Dr. Tarri Glenn and Dr. Nyoka Cowden. Phone number provided for her to call to schedule appt.

## 2019-09-16 ENCOUNTER — Other Ambulatory Visit: Payer: Self-pay | Admitting: Obstetrics & Gynecology

## 2019-09-16 ENCOUNTER — Encounter: Payer: Self-pay | Admitting: Gastroenterology

## 2019-09-16 DIAGNOSIS — Z1231 Encounter for screening mammogram for malignant neoplasm of breast: Secondary | ICD-10-CM

## 2019-09-20 ENCOUNTER — Other Ambulatory Visit: Payer: Self-pay

## 2019-09-23 ENCOUNTER — Other Ambulatory Visit: Payer: Self-pay

## 2019-09-23 ENCOUNTER — Ambulatory Visit: Payer: BC Managed Care – PPO | Admitting: Women's Health

## 2019-09-23 ENCOUNTER — Encounter: Payer: Self-pay | Admitting: Women's Health

## 2019-09-23 ENCOUNTER — Encounter: Payer: Self-pay | Admitting: Gynecology

## 2019-09-23 VITALS — BP 128/80

## 2019-09-23 DIAGNOSIS — N812 Incomplete uterovaginal prolapse: Secondary | ICD-10-CM | POA: Insufficient documentation

## 2019-09-23 DIAGNOSIS — Z4689 Encounter for fitting and adjustment of other specified devices: Secondary | ICD-10-CM | POA: Diagnosis not present

## 2019-09-23 NOTE — Progress Notes (Signed)
69 year old MWF G2 P2 presents with a problem concerning pessary which she started using in October 2020.  States feels vaginal tissue protruding around pessary and feels like pessary is going to fall out especially after bowel movements and needs to push back in..  Reports taking it out daily without difficulty and has helped with vaginal pressure/cystocele.  Denies  urinary incontinence, pain/ burning with urination, abdominal pain or fever.  Denies change in routine or activity, works at a bank.  Postmenopausal on vaginal estrogen only.  Reports mother also had cystocele, had bladder tack and is now in her 80s with incontinence.  No medical problems.  Exam: Appears younger than stated years.  No CVAT.  Abdomen soft, nontender, external genitalia +1 to +2 cystocele with bearing down with pessary in place, pessary removed with ease +4 with bearing down without pessary.  Speculum exam no areas of erosion vaginal walls weak.  #5 ring with support pessary replaced.  Cystocele/pessary  Plan: Options reviewed, reviewed bladder tack/suspension, larger ring with support pessary.  Will order #6 ring with support pessary and see if that helps also, continue vaginal Estrace cream 2-3 times weekly.

## 2019-09-23 NOTE — Patient Instructions (Signed)
Anterior and Posterior Colporrhaphy  Anterior or posterior colporrhaphy is surgery to fix a prolapse of organs in the genital tract. Prolapse is a condition in which an organ bulges or drops down from its normal position. Organs that commonly prolapse include the rectum, bladder, vagina, and uterus. Prolapse can affect a single organ or several organs at the same time.  You may need this surgery if you have a severe prolapse that causes symptoms that interfere with your daily life and cannot be corrected with other treatments. Prolapse often worsens when women stop having their monthly periods (menopause) because estrogen loss weakens the muscles and tissues in the genital tract. Prolapse can also happen when the organs are damaged or weakened. This commonly happens after childbirth and as a result of aging. The type of colporrhaphy done depends on the type of genital prolapse. Types of genital prolapse include the following:  Cystocele. This is a prolapse of the bladder and the upper part of the front (anterior) wall of the vagina.  Rectocele. This is a prolapse of the rectum and the lower part of the back (posterior) wall of the vagina.  Enterocele. This is a prolapse of the small intestine. It appears as a bulge under the neck of the uterus at the top of the back wall of the vagina.  Procidentia. This is a complete prolapse of the uterus and the cervix. The prolapse can be seen and felt coming out of the vagina. Tell a health care provider about:  Any allergies you have.  All medicines you are taking, including vitamins, herbs, eye drops, creams, and over-the-counter medicines.  Any problems you or family members have had with anesthetic medicines.  Any blood disorders you have.  Any surgeries you have had.  Any medical conditions you have.  Smoking history or history of alcohol use.  Whether you are pregnant or may be pregnant. What are the risks? Generally, this is a safe  procedure. However, problems may occur, including:  Infection.  Bleeding.  Allergic reactions to medicines.  Damage to other structures or organs.  Problems urinating.  Incontinence.  Nerve damage.  Painful sex.  Constipation.  A blood clot that travels to your lungs. What happens before the procedure? Staying hydrated Follow instructions from your health care provider about hydration, which may include:  Up to 2 hours before the procedure - you may continue to drink clear liquids, such as water, clear fruit juice, black coffee, and plain tea. Eating and drinking restrictions Follow instructions from your health care provider about eating and drinking, which may include:  8 hours before the procedure - stop eating heavy meals or foods such as meat, fried foods, or fatty foods.  6 hours before the procedure - stop eating light meals or foods, such as toast or cereal.  6 hours before the procedure - stop drinking milk or drinks that contain milk.  2 hours before the procedure - stop drinking clear liquids. General instructions  Ask your health care provider about: ? Changing or stopping your regular medicines. This is especially important if you are taking diabetes medicines or blood thinners. ? Taking medicines such as aspirin and ibuprofen. These medicines can thin your blood. Do not take these medicines before your procedure if your health care provider instructs you not to.  You may be given antibiotics to help prevent infection.  You may be instructed to use estrogen cream in your vagina to help prevent complications and promote healing.  Do not use  any products that contain nicotine or tobacco, such as cigarettes and e-cigarettes, for at least 2 weeks before the procedure. If you need help quitting, ask your health care provider.  Plan to have someone take you home from the hospital. Also, arrange for someone to help you with activities during recovery. What  happens during the procedure?  To lower your risk of infection: ? Your health care team will wash or sanitize their hands. ? Your skin will be washed with soap. ? Hair may be removed from the surgical area.  An IV will be inserted into one of your veins.  You will be given one or more of the following: ? A medicine to help you relax (sedative). ? A medicine to make you fall asleep (general anesthetic).  You may be given antibiotics through your IV.  You will lie down on the operating table with your feet in stirrups.  A small, thin tube (catheter) will be inserted through your urethra into your bladder to drain urine during surgery and recovery.  An instrument (vaginal speculum) will be used to hold your vagina open.  Your health care provider will perform the procedure according to the type of repair you require: Anterior repair  An incision will be made in the midline section of the front part of the vaginal wall.  A triangular-shaped piece of vaginal tissue will be removed.  The stronger, healthier tissue will be sewn together in order to support the bladder.  These incisions may be closed with stitches (sutures).  Gauze packing will be placed inside your vagina. Posterior repair  An incision will be made midline on the back wall of the vagina.  A triangular portion of vaginal skin will be removed to expose the muscle.  Excess tissue will be removed, and stronger, healthier muscle and ligament tissue will be sewn together to support the rectum.  These incisions may be closed with stitches (sutures).  Gauze packing will be placed inside your vagina. Anterior and posterior repair  Both procedures will be done during the same surgery. The procedure may vary among health care providers and hospitals. What happens after the procedure?  Your blood pressure, heart rate, breathing rate, and blood oxygen level will be monitored until the medicines you were given have worn  off.  You will be given pain medicine as needed.  You will have a small tube in place to drain your bladder (urinary catheter). This will be in place until your bladder is working properly on its own.  You may have a gauze packing in your vagina for a few days to prevent bleeding.  You will start on a liquid diet and slowly move to a regular diet.  You will be encouraged to get up and walk as soon as you are able.  You may need to wear compression stockings. They help prevent blood clots and reduce swelling in your legs.  Do not drive for 24 hours if you were given a sedative. Summary  Anterior or posterior colporrhaphy is surgery to fix a prolapse of organs in the genital tract.  The type of repair done depends on the type of prolapse that is present.  Follow instructions from your health care provider about eating and drinking before the procedure.  You will be given a general anesthetic to make you fall asleep during the procedure. This information is not intended to replace advice given to you by your health care provider. Make sure you discuss any questions you have  with your health care provider. Document Revised: 07/14/2017 Document Reviewed: 09/08/2016 Elsevier Patient Education  2020 [employer].

## 2019-09-29 ENCOUNTER — Ambulatory Visit: Payer: BLUE CROSS/BLUE SHIELD

## 2019-09-29 ENCOUNTER — Ambulatory Visit: Payer: BC Managed Care – PPO | Attending: Internal Medicine

## 2019-09-29 DIAGNOSIS — Z23 Encounter for immunization: Secondary | ICD-10-CM | POA: Insufficient documentation

## 2019-09-29 NOTE — Progress Notes (Signed)
   Covid-19 Vaccination Clinic  Name:  Jacqueline Garrett    MRN: [id number] DOB: 08-28-1950  09/29/2019  Jacqueline Garrett was observed post Covid-19 immunization for 15 minutes without incidence. She was provided with Vaccine Information Sheet and instruction to access the V-Safe system.   Jacqueline Garrett was instructed to call 911 with any severe reactions post vaccine: Marland Kitchen Difficulty breathing  . Swelling of your face and throat  . A fast heartbeat  . A bad rash all over your body  . Dizziness and weakness    Immunizations Administered    Name Date Dose VIS Date Route   Pfizer COVID-19 Vaccine 09/29/2019 10:56 AM 0.3 mL 07/26/2019 Intramuscular   Manufacturer: [employer], [employer]   Lot: [id number]   [place]: [id number]

## 2019-09-30 ENCOUNTER — Ambulatory Visit (AMBULATORY_SURGERY_CENTER): Payer: BC Managed Care – PPO | Admitting: *Deleted

## 2019-09-30 ENCOUNTER — Other Ambulatory Visit: Payer: Self-pay

## 2019-09-30 VITALS — Ht 63.0 in | Wt 130.0 lb

## 2019-09-30 DIAGNOSIS — Z01818 Encounter for other preprocedural examination: Secondary | ICD-10-CM

## 2019-09-30 DIAGNOSIS — Z1211 Encounter for screening for malignant neoplasm of colon: Secondary | ICD-10-CM

## 2019-09-30 MED ORDER — SUPREP BOWEL PREP KIT 17.5-3.13-1.6 GM/177ML PO SOLN: 1.0000 | Freq: Once | ORAL | 0 refills | Status: AC

## 2019-09-30 NOTE — Progress Notes (Signed)
Virutal PreVisit via phone.  Patient denies any allergies to egg or soy products. Patient denies complications with anesthesia/sedation.  Patient denies oxygen use at home and denies diet medications. Emmi instructions for colonoscopy/endoscopy explained and mailed to patient along with Suprep coupon. Patient verbalized understanding of instructions given via phone.  All questions answered.  Packet mailed to patient, verified mailing address.

## 2019-10-04 ENCOUNTER — Other Ambulatory Visit: Payer: Self-pay

## 2019-10-04 ENCOUNTER — Ambulatory Visit: Payer: BC Managed Care – PPO | Admitting: Obstetrics & Gynecology

## 2019-10-04 ENCOUNTER — Encounter: Payer: Self-pay | Admitting: Obstetrics & Gynecology

## 2019-10-04 DIAGNOSIS — Z4689 Encounter for fitting and adjustment of other specified devices: Secondary | ICD-10-CM

## 2019-10-04 DIAGNOSIS — N812 Incomplete uterovaginal prolapse: Secondary | ICD-10-CM | POA: Diagnosis not present

## 2019-10-04 NOTE — Progress Notes (Signed)
    Shirleyan Farler [birth year] [id number]        69 y.o.  G2P2L2  RP: Needs Pessary fitting and placement  HPI: Milex Ring #5 with support was allowing the Cystocele to protrude and the pessary was coming out at time of BMs.   OB History  Gravida Para Term Preterm AB Living  2 2       2   SAB TAB Ectopic Multiple Live Births               # Outcome Date GA Lbr Len/2nd Weight Sex Delivery Anes PTL Lv  2 Para           1 Para             Past medical history,surgical history, problem list, medications, allergies, family history and social history were all reviewed and documented in the EPIC chart.   Directed ROS with pertinent positives and negatives documented in the history of present illness/assessment and plan.  Exam:  There were no vitals filed for this visit. General appearance:  Normal  Abdomen: Normal  Gynecologic exam: Vulva normal.  Cystocele/Mild uterine prolapse/Rectoce unchanged.  Milex Ring #6 with support fitted.  Good fit, easily went in good position for support.   Assessment/Plan:  69 y.o. G2P2   1. Encounter for fitting and adjustment of pessary New pessary fitted and put in place.  Good fit of Milex ring #6 with support.  Patient will see if can remove and clean at home.  If not, f/u here for pessary maintenance.  2. Cystocele with incomplete uterovaginal prolapse Stable.  Princess Bruins MD, 2:38 PM 10/04/2019

## 2019-10-04 NOTE — Patient Instructions (Signed)
1. Encounter for fitting and adjustment of pessary New pessary fitted and put in place.  Good fit of Milex ring #6 with support.  Patient will see if can remove and clean at home.  If not, f/u here for pessary maintenance.  2. Cystocele with incomplete uterovaginal prolapse Stable.  Jacqueline Garrett, it was a pleasure seeing you today!

## 2019-10-09 ENCOUNTER — Ambulatory Visit (INDEPENDENT_AMBULATORY_CARE_PROVIDER_SITE_OTHER): Payer: BC Managed Care – PPO

## 2019-10-09 ENCOUNTER — Other Ambulatory Visit: Payer: Self-pay | Admitting: Gastroenterology

## 2019-10-09 ENCOUNTER — Other Ambulatory Visit: Payer: Self-pay

## 2019-10-09 DIAGNOSIS — Z1159 Encounter for screening for other viral diseases: Secondary | ICD-10-CM

## 2019-10-10 ENCOUNTER — Encounter: Payer: Self-pay | Admitting: Gastroenterology

## 2019-10-10 LAB — SARS CORONAVIRUS 2 (TAT 6-24 HRS): SARS Coronavirus 2: NEGATIVE

## 2019-10-14 ENCOUNTER — Other Ambulatory Visit: Payer: Self-pay

## 2019-10-14 ENCOUNTER — Ambulatory Visit (AMBULATORY_SURGERY_CENTER): Payer: BC Managed Care – PPO | Admitting: Gastroenterology

## 2019-10-14 ENCOUNTER — Encounter: Payer: Self-pay | Admitting: Gastroenterology

## 2019-10-14 VITALS — BP 97/55 | HR 65 | Temp 96.8°F | Resp 14 | Ht 63.0 in | Wt 130.0 lb

## 2019-10-14 DIAGNOSIS — Z1211 Encounter for screening for malignant neoplasm of colon: Secondary | ICD-10-CM | POA: Diagnosis present

## 2019-10-14 DIAGNOSIS — D123 Benign neoplasm of transverse colon: Secondary | ICD-10-CM | POA: Diagnosis not present

## 2019-10-14 DIAGNOSIS — D128 Benign neoplasm of rectum: Secondary | ICD-10-CM

## 2019-10-14 DIAGNOSIS — D125 Benign neoplasm of sigmoid colon: Secondary | ICD-10-CM | POA: Diagnosis not present

## 2019-10-14 DIAGNOSIS — K621 Rectal polyp: Secondary | ICD-10-CM

## 2019-10-14 MED ORDER — SODIUM CHLORIDE 0.9 % IV SOLN: 500.0000 mL | Freq: Once | INTRAVENOUS | Status: DC

## 2019-10-14 NOTE — Progress Notes (Signed)
Called to room to assist during endoscopic procedure.  Patient ID and intended procedure confirmed with present staff. Received instructions for my participation in the procedure from the performing physician.  

## 2019-10-14 NOTE — Patient Instructions (Signed)
Impression/Recommendations:   Polyp handout given to patient. Diverticulosis handout given to patient.   High fiber diet handout given to patient.  Drink at least 64 ounces of water daily.  Consider adding a stool bulking agent such as Metamucil every day.  Continue present medications. Await pathology results.  Repeat colonoscopy.  Date to be determined after pathology results are reviewed.  YOU HAD AN ENDOSCOPIC PROCEDURE TODAY AT [facility]:   Refer to the procedure report that was given to you for any specific questions about what was found during the examination.  If the procedure report does not answer your questions, please call your gastroenterologist to clarify.  If you requested that your care partner not be given the details of your procedure findings, then the procedure report has been included in a sealed envelope for you to review at your convenience later.  YOU SHOULD EXPECT: Some feelings of bloating in the abdomen. Passage of more gas than usual.  Walking can help get rid of the air that was put into your GI tract during the procedure and reduce the bloating. If you had a lower endoscopy (such as a colonoscopy or flexible sigmoidoscopy) you may notice spotting of blood in your stool or on the toilet paper. If you underwent a bowel prep for your procedure, you may not have a normal bowel movement for a few days.  Please Note:  You might notice some irritation and congestion in your nose or some drainage.  This is from the oxygen used during your procedure.  There is no need for concern and it should clear up in a day or so.  SYMPTOMS TO REPORT IMMEDIATELY:   Following lower endoscopy (colonoscopy or flexible sigmoidoscopy):  Excessive amounts of blood in the stool  Significant tenderness or worsening of abdominal pains  Swelling of the abdomen that is new, acute  Fever of 100F or higher  For urgent or emergent issues, a gastroenterologist can be reached  at any hour by calling [phone].   DIET:  We do recommend a small meal at first, but then you may proceed to your regular diet.  Drink plenty of fluids but you should avoid alcoholic beverages for 24 hours.  ACTIVITY:  You should plan to take it easy for the rest of today and you should NOT DRIVE or use heavy machinery until tomorrow (because of the sedation medicines used during the test).    FOLLOW UP: Our staff will call the number listed on your records 48-72 hours following your procedure to check on you and address any questions or concerns that you may have regarding the information given to you following your procedure. If we do not reach you, we will leave a message.  We will attempt to reach you two times.  During this call, we will ask if you have developed any symptoms of COVID 19. If you develop any symptoms (ie: fever, flu-like symptoms, shortness of breath, cough etc.) before then, please call [phone].  If you test positive for Covid 19 in the 2 weeks post procedure, please call and report this information to [place].    If any biopsies were taken you will be contacted by phone or by letter within the next 1-3 weeks.  Please call us at [phone] if you have not heard about the biopsies in 3 weeks.    SIGNATURES/CONFIDENTIALITY: You and/or your care partner have signed paperwork which will be entered into your electronic medical record.  These signatures  attest to the fact that that the information above on your After Visit Summary has been reviewed and is understood.  Full responsibility of the confidentiality of this discharge information lies with you and/or your care-partner.

## 2019-10-14 NOTE — Op Note (Addendum)
[place] Patient Name: Jacqueline Garrett Procedure Date: 10/14/2019 10:01 AM MRN: [id number] Endoscopist: Thornton Park MD, MD Age: 69 Referring MD:  Date of Birth: Aug 26, 1950 Gender: Female Account #: [id number] Procedure:                Colonoscopy Indications:              Screening for colorectal malignant neoplasm, This                            is the patient's first colonoscopy                           No known family history of colon cancer or polyps Medicines:                Monitored Anesthesia Care Procedure:                Pre-Anesthesia Assessment:                           - Prior to the procedure, a History and Physical                            was performed, and patient medications and                            allergies were reviewed. The patient's tolerance of                            previous anesthesia was also reviewed. The risks                            and benefits of the procedure and the sedation                            options and risks were discussed with the patient.                            All questions were answered, and informed consent                            was obtained. Prior Anticoagulants: The patient has                            taken no previous anticoagulant or antiplatelet                            agents. ASA Grade Assessment: II - A patient with                            mild systemic disease. After reviewing the risks                            and benefits, the patient was deemed in  satisfactory condition to undergo the procedure.                           After obtaining informed consent, the colonoscope                            was passed under direct vision. Throughout the                            procedure, the patient's blood pressure, pulse, and                            oxygen saturations were monitored continuously. The                            Colonoscope  was introduced through the anus and                            advanced to the 3 cm into the ileum. A second                            forward view of the right colon was performed. The                            colonoscopy was performed without difficulty. The                            patient tolerated the procedure well. The quality                            of the bowel preparation was good. The terminal                            ileum, ileocecal valve, appendiceal orifice, and                            rectum were photographed. Scope In: 10:05:13 AM Scope Out: 10:21:45 AM Scope Withdrawal Time: 0 hours 12 minutes 47 seconds  Total Procedure Duration: 0 hours 16 minutes 32 seconds  Findings:                 The perianal and digital rectal examinations were                            normal.                           Multiple small and large-mouthed diverticula were                            found in the sigmoid colon and descending colon.                           A 2 mm polyp was found in the distal rectum nearly  adjacent to the dentate line. The polyp was flat.                            The polyp was removed with a cold biopsy forceps as                            I was afraid using a snare may involve the dentate                            line. Resection and retrieval were complete.                            Estimated blood loss was minimal.                           A 2 mm polyp was found in the sigmoid colon. The                            polyp was sessile. The polyp was removed with a                            cold snare. Resection and retrieval were complete.                            Estimated blood loss was minimal.                           A 11 mm polyp was found in the hepatic flexure. The                            polyp was flat. The polyp was removed with a cold                            snare. Resection and retrieval were  complete.                            Estimated blood loss was minimal.                           The exam was otherwise without abnormality on                            direct and retroflexion views. Complications:            No immediate complications. Estimated blood loss:                            Minimal. Estimated Blood Loss:     Estimated blood loss was minimal. Impression:               - Diverticulosis in the sigmoid colon and in the  descending colon.                           - One 2 mm polyp in the distal rectum, removed with                            a cold biopsy forceps. Resected and retrieved.                           - One 2 mm polyp in the sigmoid colon, removed with                            a cold snare. Resected and retrieved.                           - One 11 mm polyp at the hepatic flexure, removed                            with a cold snare. Resected and retrieved.                           - The examination was otherwise normal on direct                            and retroflexion views. Recommendation:           - Patient has a contact number available for                            emergencies. The signs and symptoms of potential                            delayed complications were discussed with the                            patient. Return to normal activities tomorrow.                            Written discharge instructions were provided to the                            patient.                           - High fiber diet. Drink at least 64 ounces of                            water daily. Consider adding a stool bulking agent                            such as Metamucil every day.                           - Continue present medications.                           -  Await pathology results.                           - Repeat colonoscopy date to be determined after                            pending pathology results are  reviewed for                            surveillance. Thornton Park MD, MD 10/14/2019 10:28:56 AM This report has been signed electronically.

## 2019-10-14 NOTE — Progress Notes (Signed)
Pt tolerated well. VSS. Awake and to recovery. 

## 2019-10-14 NOTE — Progress Notes (Signed)
Temp-JB VS-KA  

## 2019-10-16 ENCOUNTER — Telehealth: Payer: Self-pay | Admitting: *Deleted

## 2019-10-16 ENCOUNTER — Encounter: Payer: Self-pay | Admitting: Gastroenterology

## 2019-10-16 ENCOUNTER — Telehealth: Payer: Self-pay

## 2019-10-16 NOTE — Telephone Encounter (Signed)
No answer for post procedure call back. Unable to leave message. 

## 2019-10-16 NOTE — Telephone Encounter (Signed)
No answer, left message to call back later today, B.Linnea Todisco RN. 

## 2019-10-22 ENCOUNTER — Ambulatory Visit: Payer: BC Managed Care – PPO | Attending: Internal Medicine

## 2019-10-22 DIAGNOSIS — Z23 Encounter for immunization: Secondary | ICD-10-CM | POA: Insufficient documentation

## 2019-10-22 NOTE — Progress Notes (Signed)
   Covid-19 Vaccination Clinic  Name:  Jacqueline Garrett    MRN: [id number] DOB: 1951-04-11  10/22/2019  [name] was observed post Covid-19 immunization for 15 minutes without incident. She was provided with Vaccine Information Sheet and instruction to access the V-Safe system.   [name] was instructed to call 911 with any severe reactions post vaccine: Marland Kitchen Difficulty breathing  . Swelling of face and throat  . A fast heartbeat  . A bad rash all over body  . Dizziness and weakness   Immunizations Administered    Name Date Dose VIS Date Route   Pfizer COVID-19 Vaccine 10/22/2019  5:48 PM 0.3 mL 07/26/2019 Intramuscular   Manufacturer: [place]   Lot: [id number]   [place]: [id number]

## 2019-10-23 ENCOUNTER — Ambulatory Visit: Payer: BLUE CROSS/BLUE SHIELD

## 2019-12-04 ENCOUNTER — Ambulatory Visit
Admission: RE | Admit: 2019-12-04 | Discharge: 2019-12-04 | Disposition: A | Discharge status: Home / Self Care | Payer: BC Managed Care – PPO | Source: Ambulatory Visit | Attending: Obstetrics & Gynecology | Admitting: Obstetrics & Gynecology

## 2019-12-04 ENCOUNTER — Other Ambulatory Visit: Payer: Self-pay

## 2019-12-04 DIAGNOSIS — Z1231 Encounter for screening mammogram for malignant neoplasm of breast: Secondary | ICD-10-CM

## 2019-12-17 ENCOUNTER — Telehealth: Payer: Self-pay | Admitting: *Deleted

## 2019-12-17 NOTE — Telephone Encounter (Signed)
Patient called and left message requesting refill on estradiol vaginal cream. I called patient back and left detailed message on home# that per note 06/2019, she is not taking estrogen. I asked her to call to discuss.

## 2020-03-31 ENCOUNTER — Telehealth: Payer: Self-pay | Admitting: *Deleted

## 2020-03-31 MED ORDER — ESTRADIOL 0.1 MG/GM VA CREA: TOPICAL_CREAM | VAGINAL | 4 refills | Status: DC

## 2020-03-31 NOTE — Telephone Encounter (Signed)
Agree with represcribing Estradiol cream 1/4 applicator twice a week.  3 months, refill x 4.  Recommend cleaning the pessary max twice a week to avoid irritating herself.  Could synchronize with the Estradiol cream application.

## 2020-03-31 NOTE — Telephone Encounter (Signed)
Left message for patient to call.

## 2020-03-31 NOTE — Addendum Note (Signed)
Addended by: Thamas Jaegers on: 03/31/2020 12:30 PM   Modules accepted: Orders

## 2020-03-31 NOTE — Telephone Encounter (Signed)
Butch Penny informed patient with below message, Rx sent.

## 2020-03-31 NOTE — Telephone Encounter (Signed)
Patient called has pessary and removes nighty to clean,but has now started removing every 2 days to clean.  She reports using KY Jelly at first, but this caused irritation so she stopped. The pessary came with a jelly called Trimo-san this time and she has been using this to help insert pessary comfortably. She called asking if you think okay to start back on estradiol cream 0.1mg /gm ( she will need Rx) she reports this cream kept her vaginal more moist verses using the trimo-san cream. I did tell her if she does start back on estradiol vaginal cream, she could probably discontinue the trimo-san jelly. She did say the Trimo-san does help with inserting pessary, however she does have to use jelly each time she inserts pessary. Please advise

## 2020-07-27 ENCOUNTER — Ambulatory Visit (INDEPENDENT_AMBULATORY_CARE_PROVIDER_SITE_OTHER): Payer: BC Managed Care – PPO | Admitting: Obstetrics & Gynecology

## 2020-07-27 ENCOUNTER — Other Ambulatory Visit: Payer: Self-pay

## 2020-07-27 ENCOUNTER — Encounter: Payer: Self-pay | Admitting: Obstetrics & Gynecology

## 2020-07-27 VITALS — BP 118/70 | Ht 63.0 in | Wt 123.4 lb

## 2020-07-27 DIAGNOSIS — N8110 Cystocele, unspecified: Secondary | ICD-10-CM | POA: Diagnosis not present

## 2020-07-27 DIAGNOSIS — Z780 Asymptomatic menopausal state: Secondary | ICD-10-CM

## 2020-07-27 DIAGNOSIS — Z01419 Encounter for gynecological examination (general) (routine) without abnormal findings: Secondary | ICD-10-CM | POA: Diagnosis not present

## 2020-07-27 DIAGNOSIS — Z13820 Encounter for screening for osteoporosis: Secondary | ICD-10-CM

## 2020-07-27 DIAGNOSIS — N952 Postmenopausal atrophic vaginitis: Secondary | ICD-10-CM

## 2020-07-27 MED ORDER — ESTRADIOL 0.1 MG/GM VA CREA: TOPICAL_CREAM | VAGINAL | 4 refills | Status: DC

## 2020-07-27 MED ORDER — TRIMO-SAN 0.025 % VA GEL: 1.0000 | VAGINAL | 4 refills | Status: DC | PRN

## 2020-07-27 NOTE — Progress Notes (Signed)
Jacqueline Garrett [birth year] [id number]   History:    69 y.o. G2P2L2 Married  RP:  Established patient presenting for annual gyn exam   HPI: Postmenopause, well on no HRT.  No PMB.  No pelvic pain.  Well with Milex ring #5 with support for Cystocele grade 4/4.  Cleaning it herself weekly.  Breasts normal.  BMI 21.86.  Walking regularly.  Will f/u here for Fasting Health Labs.  Past medical history,surgical history, family history and social history were all reviewed and documented in the EPIC chart.  Gynecologic History No LMP recorded (lmp unknown). Patient is postmenopausal.  Obstetric History OB History  Gravida Para Term Preterm AB Living  '2 2       2  ' SAB IAB Ectopic Multiple Live Births               # Outcome Date GA Lbr Len/2nd Weight Sex Delivery Anes PTL Lv  2 Para           1 Para              ROS: A ROS was performed and pertinent positives and negatives are included in the history.  GENERAL: No fevers or chills. HEENT: No change in vision, no earache, sore throat or sinus congestion. NECK: No pain or stiffness. CARDIOVASCULAR: No chest pain or pressure. No palpitations. PULMONARY: No shortness of breath, cough or wheeze. GASTROINTESTINAL: No abdominal pain, nausea, vomiting or diarrhea, melena or bright red blood per rectum. GENITOURINARY: No urinary frequency, urgency, hesitancy or dysuria. MUSCULOSKELETAL: No joint or muscle pain, no back pain, no recent trauma. DERMATOLOGIC: No rash, no itching, no lesions. ENDOCRINE: No polyuria, polydipsia, no heat or cold intolerance. No recent change in weight. HEMATOLOGICAL: No anemia or easy bruising or bleeding. NEUROLOGIC: No headache, seizures, numbness, tingling or weakness. PSYCHIATRIC: No depression, no loss of interest in normal activity or change in sleep pattern.     Exam:   BP 140/80   Ht '5\' 3"'  (1.6 m)   Wt 123 lb 6.4 oz (56 kg)   LMP  (LMP Unknown)   BMI 21.86 kg/m   Body mass index is 21.86  kg/m.  General appearance : Well developed well nourished female. No acute distress HEENT: Eyes: no retinal hemorrhage or exudates,  Neck supple, trachea midline, no carotid bruits, no thyroidmegaly Lungs: Clear to auscultation, no rhonchi or wheezes, or rib retractions  Heart: Regular rate and rhythm, no murmurs or gallops Breast:Examined in sitting and supine position were symmetrical in appearance, no palpable masses or tenderness,  no skin retraction, no nipple inversion, no nipple discharge, no skin discoloration, no axillary or supraclavicular lymphadenopathy Abdomen: no palpable masses or tenderness, no rebound or guarding Extremities: no edema or skin discoloration or tenderness  Pelvic: Vulva: Normal             Vagina: No gross lesions or discharge.  Cystocele grade 4/4.  Rectocele minimal.  Cervix: No gross lesions or discharge  Uterus  AV, normal size, shape and consistency, non-tender and mobile  Adnexa  Without masses or tenderness  Anus: Normal   Assessment/Plan:  69 y.o. female for annual exam   1. Well female exam with routine gynecological exam Normal gynecologic exam except for a cystocele grade 4/4 and a minimal rectocele.  No indication for Pap test this year.  Breast exam normal.  Screening mammogram April 2021 was negative.  Colonoscopy March 2021.  Follow-up here for fasting health labs. - CBC; Future -  Comp Met (CMET); Future - TSH; Future - Lipid panel; Future - VITAMIN D 25 Hydroxy (Vit-D Deficiency, Fractures); Future  2. Postmenopausal Well on no systemic hormone replacement therapy.  No post menopausal bleeding.  3. Post-menopausal atrophic vaginitis Postmenopausal atrophic vaginitis controlled on estradiol cream.  No contraindication.  We will continue on estradiol 14:23 applicator vaginally twice a week.  Usage known.  Prescription sent to pharmacy.  4. Baden-Walker grade 4 cystocele Cystocele grade 4/4 managed with a pessary, Milex ring #5 with  support.  Good fit and no complication.  Patient is able to remove the pessary, cleaned it and put it back in place.  5. Screening for osteoporosis Vitamin D supplements, calcium intake of 1500 mg daily and regular weightbearing physical activities to continue.  We will follow-up here for a bone density. - DG Bone Density; Future  Other orders - estradiol (ESTRACE) 0.1 MG/GM vaginal cream; Use 1 applicator cream twice weekly vaginally as needed. - OXYQUINOLONE SULFATE VAGINAL (TRIMO-SAN) 0.025 % GEL; Place 1 Applicatorful vaginally as needed.  Princess Bruins MD, 3:29 PM 07/27/2020

## 2020-07-28 ENCOUNTER — Encounter: Payer: Self-pay | Admitting: Obstetrics & Gynecology

## 2020-08-12 ENCOUNTER — Other Ambulatory Visit: Payer: BC Managed Care – PPO

## 2020-08-12 ENCOUNTER — Other Ambulatory Visit: Payer: Self-pay

## 2020-08-12 DIAGNOSIS — Z01419 Encounter for gynecological examination (general) (routine) without abnormal findings: Secondary | ICD-10-CM

## 2020-08-13 LAB — CBC
HCT: 45 % (ref 35.0–45.0)
Hemoglobin: 15.4 g/dL (ref 11.7–15.5)
MCH: 32.6 pg (ref 27.0–33.0)
MCHC: 34.2 g/dL (ref 32.0–36.0)
MCV: 95.1 fL (ref 80.0–100.0)
MPV: 10.1 fL (ref 7.5–12.5)
Platelets: 231 10*3/uL (ref 140–400)
RBC: 4.73 10*6/uL (ref 3.80–5.10)
RDW: 12.4 % (ref 11.0–15.0)
WBC: 3.9 10*3/uL (ref 3.8–10.8)

## 2020-08-13 LAB — LIPID PANEL
Cholesterol: 250 mg/dL — ABNORMAL HIGH (ref <200)
HDL: 81 mg/dL (ref >=50)
LDL Cholesterol (Calc): 145 mg/dL (calc) — ABNORMAL HIGH
Non-HDL Cholesterol (Calc): 169 mg/dL (calc) — ABNORMAL HIGH (ref <130)
Total CHOL/HDL Ratio: 3.1 (calc) (ref <5.0)
Triglycerides: 119 mg/dL (ref <150)

## 2020-08-13 LAB — COMPREHENSIVE METABOLIC PANEL
AG Ratio: 1.8 (calc) (ref 1.0–2.5)
ALT: 48 U/L — ABNORMAL HIGH (ref 6–29)
AST: 29 U/L (ref 10–35)
Albumin: 4.4 g/dL (ref 3.6–5.1)
Alkaline phosphatase (APISO): 70 U/L (ref 37–153)
BUN: 16 mg/dL (ref 7–25)
CO2: 29 mmol/L (ref 20–32)
Calcium: 9.8 mg/dL (ref 8.6–10.4)
Chloride: 103 mmol/L (ref 98–110)
Creat: 0.86 mg/dL (ref 0.50–0.99)
Globulin: 2.5 g/dL (calc) (ref 1.9–3.7)
Glucose, Bld: 93 mg/dL (ref 65–99)
Potassium: 4.4 mmol/L (ref 3.5–5.3)
Sodium: 140 mmol/L (ref 135–146)
Total Bilirubin: 0.7 mg/dL (ref 0.2–1.2)
Total Protein: 6.9 g/dL (ref 6.1–8.1)

## 2020-08-13 LAB — TSH: TSH: 2.94 mIU/L (ref 0.40–4.50)

## 2020-08-13 LAB — VITAMIN D 25 HYDROXY (VIT D DEFICIENCY, FRACTURES): Vit D, 25-Hydroxy: 33 ng/mL (ref 30–100)

## 2020-10-26 IMAGING — MG DIGITAL SCREENING BILATERAL MAMMOGRAM WITH CAD
4 series · 4 of 4 positions shown · non-contrast
Comparison: Previous exam(s).

CLINICAL DATA: Screening.

EXAM:
DIGITAL SCREENING BILATERAL MAMMOGRAM WITH CAD

[R MLO]
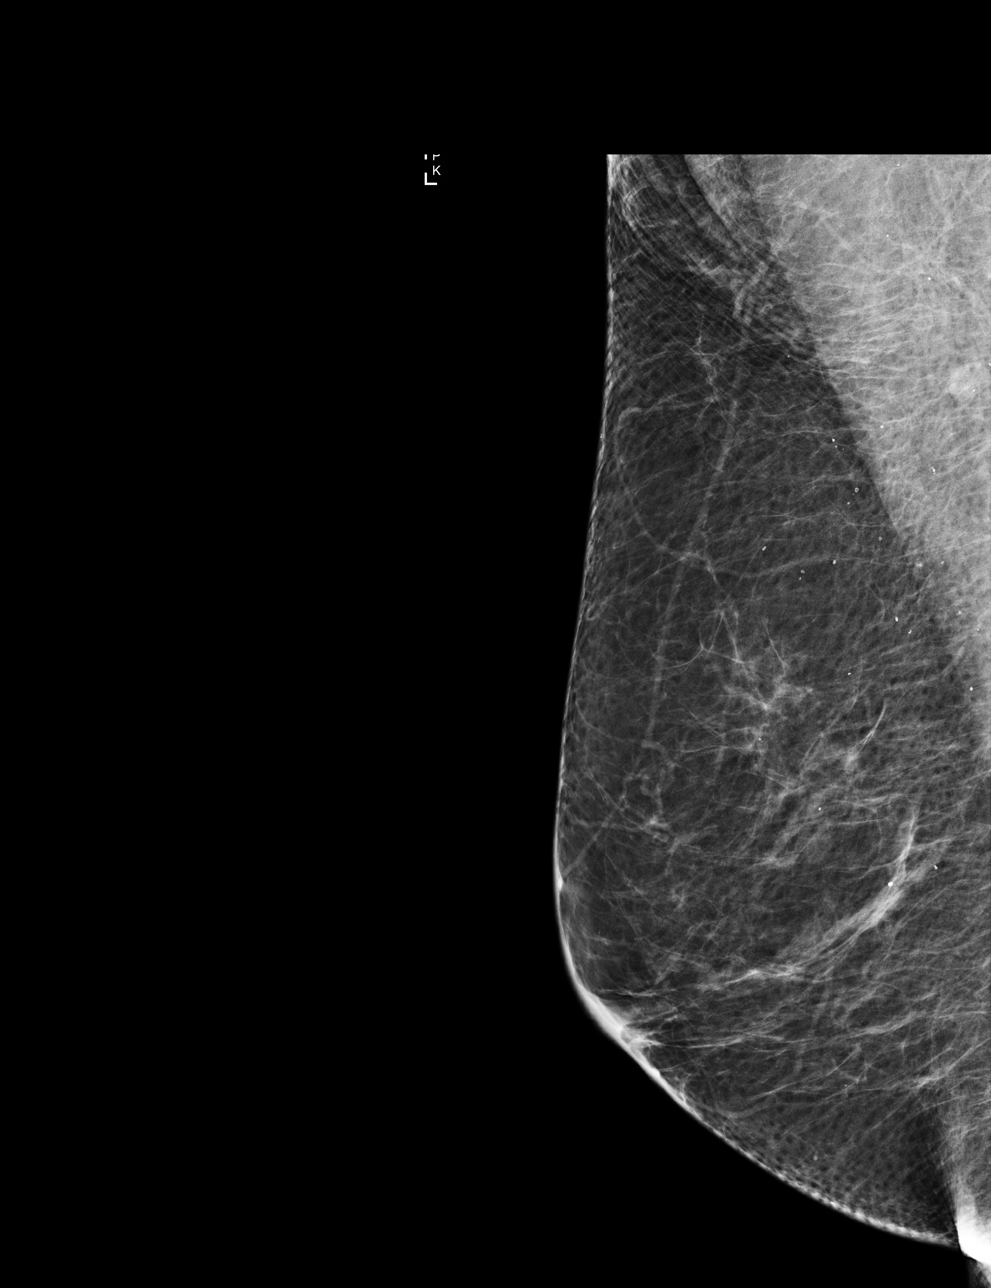

[L CC]
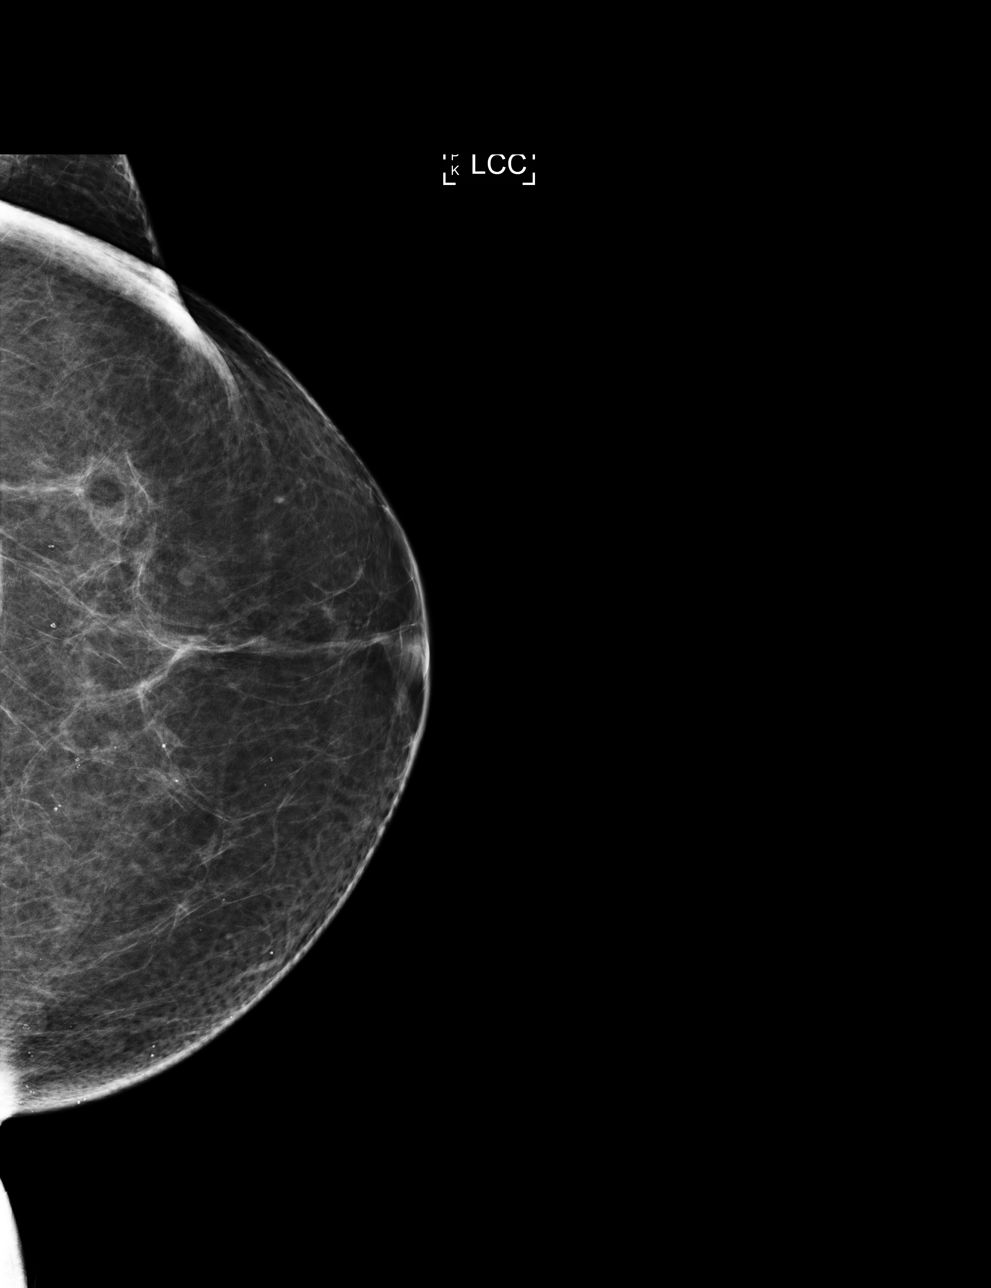

[R CC]
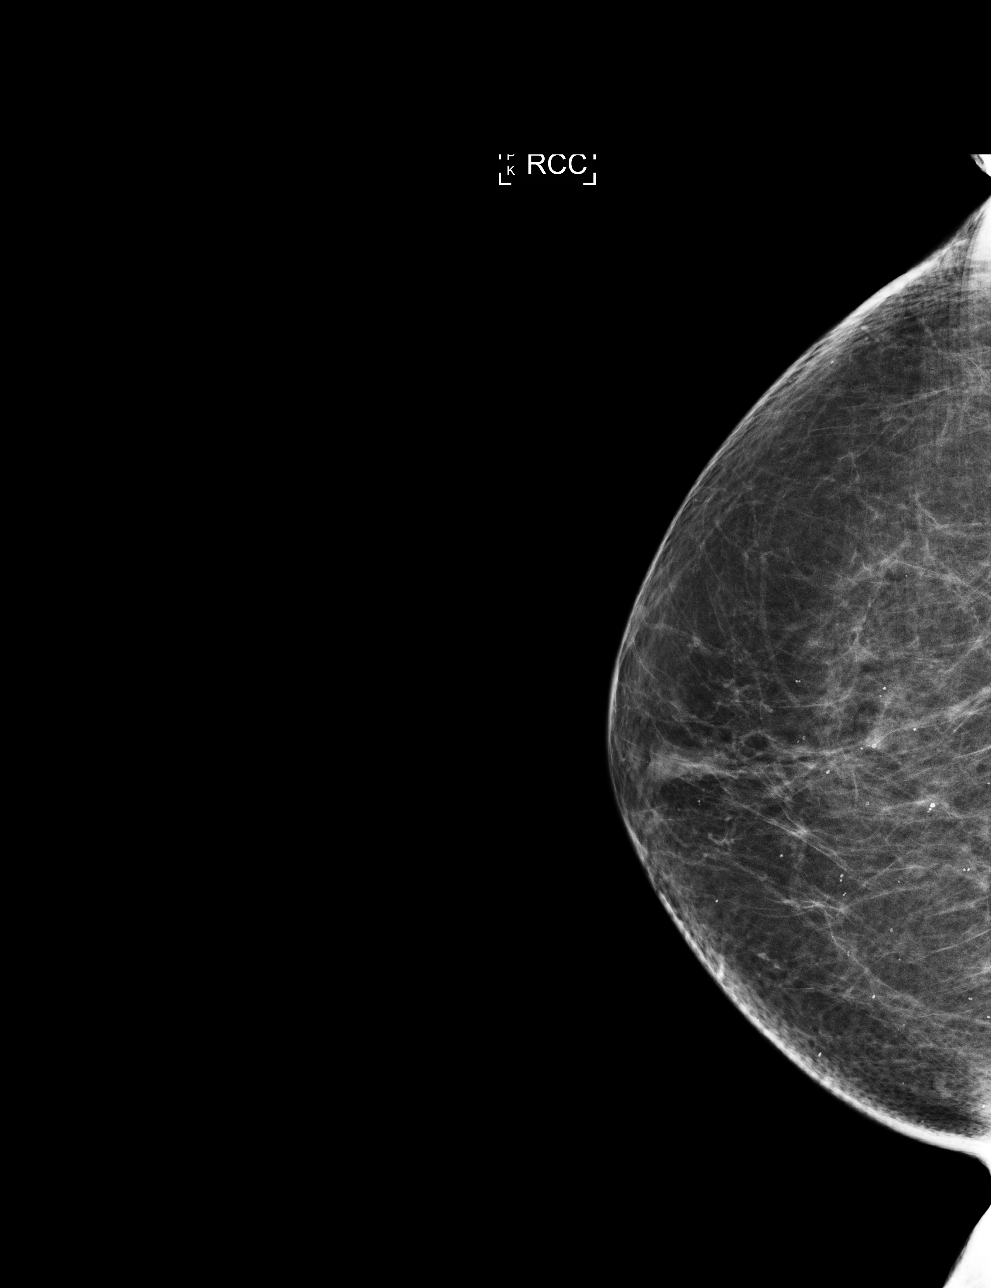

[L MLO]
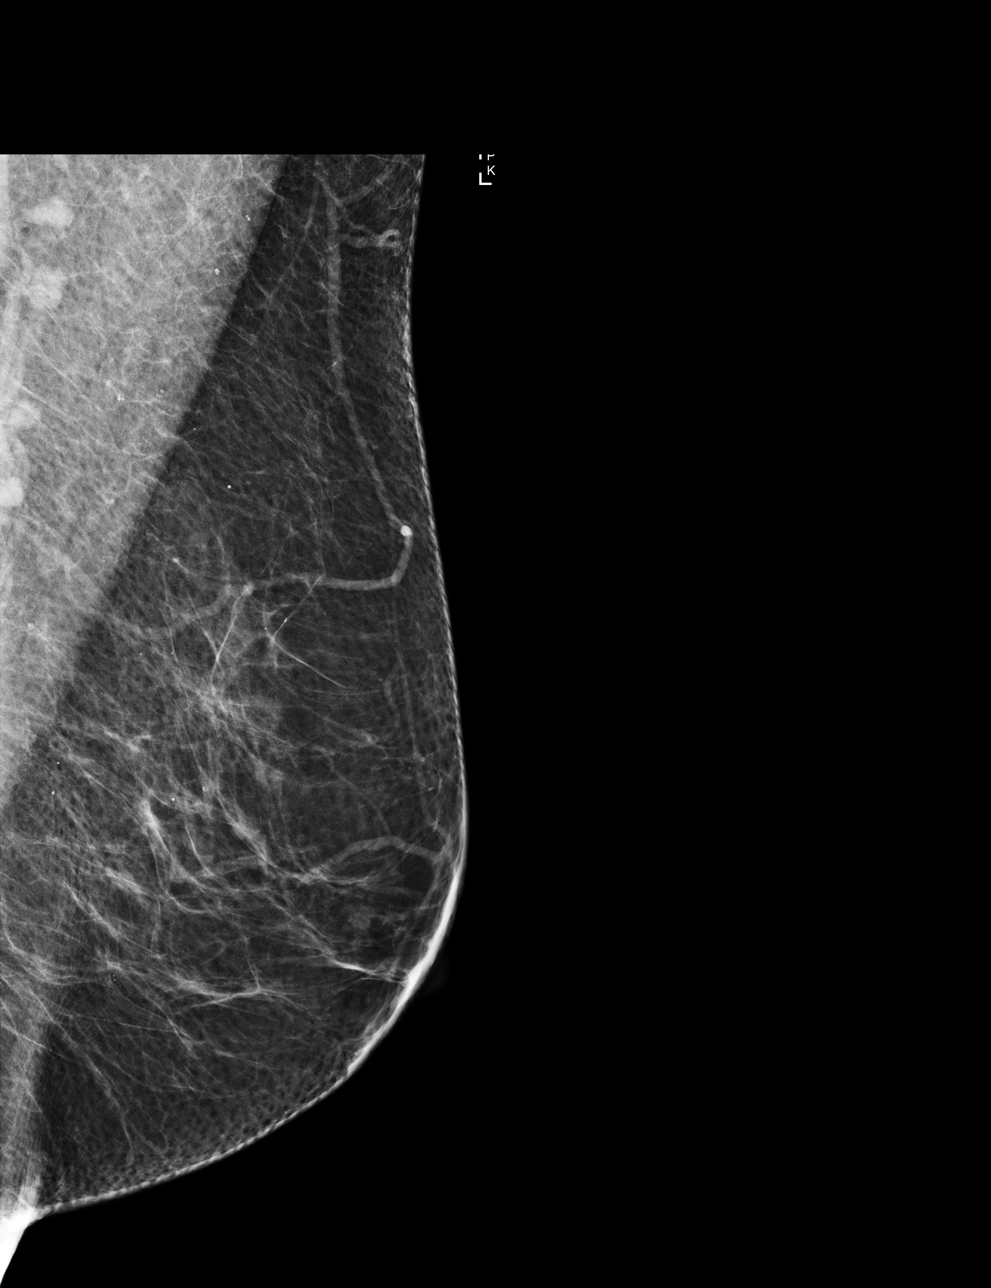

[4 of 4 positions shown; findings below may reference images not displayed]

ACR Breast Density Category b: There are scattered areas of
fibroglandular density.
FINDINGS: There are no findings suspicious for malignancy. Images were
processed with CAD.
IMPRESSION: No mammographic evidence of malignancy. A result letter of this
screening mammogram will be mailed directly to the patient.

RECOMMENDATION:
Screening mammogram in one year. (Code:AS-G-LCT)

BI-RADS CATEGORY  1: Negative.

## 2020-11-26 ENCOUNTER — Other Ambulatory Visit: Payer: Self-pay | Admitting: Obstetrics & Gynecology

## 2020-11-26 DIAGNOSIS — Z1231 Encounter for screening mammogram for malignant neoplasm of breast: Secondary | ICD-10-CM

## 2021-01-18 ENCOUNTER — Ambulatory Visit: Payer: BC Managed Care – PPO

## 2021-03-16 ENCOUNTER — Other Ambulatory Visit: Payer: Self-pay

## 2021-03-16 ENCOUNTER — Ambulatory Visit
Admission: RE | Admit: 2021-03-16 | Discharge: 2021-03-16 | Disposition: A | Discharge status: Home / Self Care | Payer: BC Managed Care – PPO | Source: Ambulatory Visit | Attending: Obstetrics & Gynecology | Admitting: Obstetrics & Gynecology

## 2021-03-16 DIAGNOSIS — Z1231 Encounter for screening mammogram for malignant neoplasm of breast: Secondary | ICD-10-CM

## 2021-05-25 ENCOUNTER — Other Ambulatory Visit: Payer: Self-pay

## 2021-05-25 ENCOUNTER — Telehealth: Payer: Self-pay

## 2021-05-25 DIAGNOSIS — Z13220 Encounter for screening for lipoid disorders: Secondary | ICD-10-CM

## 2021-05-25 DIAGNOSIS — Z1321 Encounter for screening for nutritional disorder: Secondary | ICD-10-CM

## 2021-05-25 DIAGNOSIS — Z01419 Encounter for gynecological examination (general) (routine) without abnormal findings: Secondary | ICD-10-CM

## 2021-05-25 DIAGNOSIS — Z1329 Encounter for screening for other suspected endocrine disorder: Secondary | ICD-10-CM

## 2021-05-25 NOTE — Telephone Encounter (Signed)
Per Dr. Marguerita Merles routing comment " Yes, agree.  CBC, CMP, FLP, TSH, Vit D.   Orders placed and left message for patient that lab orders have been placed. Her Lab appointment is already scheduled. Reminded her to come fasting and left fasting instructions.

## 2021-05-25 NOTE — Telephone Encounter (Signed)
Patient called stating she has her AEX scheduled 08/18/20 with Dr. Marguerita Merles. She would like to have her labwork drawn on 07/29/21 at 11am.  If okay for labs ahead of time please advise what orders to place.

## 2021-07-29 ENCOUNTER — Other Ambulatory Visit: Payer: Self-pay

## 2021-07-29 ENCOUNTER — Other Ambulatory Visit: Payer: BC Managed Care – PPO

## 2021-07-29 DIAGNOSIS — Z13220 Encounter for screening for lipoid disorders: Secondary | ICD-10-CM

## 2021-07-29 DIAGNOSIS — Z01419 Encounter for gynecological examination (general) (routine) without abnormal findings: Secondary | ICD-10-CM

## 2021-07-29 DIAGNOSIS — Z1329 Encounter for screening for other suspected endocrine disorder: Secondary | ICD-10-CM

## 2021-07-29 DIAGNOSIS — Z1321 Encounter for screening for nutritional disorder: Secondary | ICD-10-CM

## 2021-07-30 LAB — CBC WITH DIFFERENTIAL/PLATELET
Absolute Monocytes: 437 cells/uL (ref 200–950)
Basophils Absolute: 51 cells/uL (ref 0–200)
Basophils Relative: 1.1 %
Eosinophils Absolute: 87 cells/uL (ref 15–500)
Eosinophils Relative: 1.9 %
HCT: 44 % (ref 35.0–45.0)
Hemoglobin: 14.8 g/dL (ref 11.7–15.5)
Lymphs Abs: 1440 cells/uL (ref 850–3900)
MCH: 32 pg (ref 27.0–33.0)
MCHC: 33.6 g/dL (ref 32.0–36.0)
MCV: 95 fL (ref 80.0–100.0)
MPV: 10.5 fL (ref 7.5–12.5)
Monocytes Relative: 9.5 %
Neutro Abs: 2585 cells/uL (ref 1500–7800)
Neutrophils Relative %: 56.2 %
Platelets: 250 10*3/uL (ref 140–400)
RBC: 4.63 10*6/uL (ref 3.80–5.10)
RDW: 12.2 % (ref 11.0–15.0)
Total Lymphocyte: 31.3 %
WBC: 4.6 10*3/uL (ref 3.8–10.8)

## 2021-07-30 LAB — LIPID PANEL
Cholesterol: 215 mg/dL — ABNORMAL HIGH (ref <200)
HDL: 77 mg/dL (ref >=50)
LDL Cholesterol (Calc): 118 mg/dL (calc) — ABNORMAL HIGH
Non-HDL Cholesterol (Calc): 138 mg/dL (calc) — ABNORMAL HIGH (ref <130)
Total CHOL/HDL Ratio: 2.8 (calc) (ref <5.0)
Triglycerides: 102 mg/dL (ref <150)

## 2021-07-30 LAB — COMPREHENSIVE METABOLIC PANEL
AG Ratio: 2 (calc) (ref 1.0–2.5)
ALT: 30 U/L — ABNORMAL HIGH (ref 6–29)
AST: 29 U/L (ref 10–35)
Albumin: 4.4 g/dL (ref 3.6–5.1)
Alkaline phosphatase (APISO): 60 U/L (ref 37–153)
BUN: 14 mg/dL (ref 7–25)
CO2: 26 mmol/L (ref 20–32)
Calcium: 9.2 mg/dL (ref 8.6–10.4)
Chloride: 102 mmol/L (ref 98–110)
Creat: 0.74 mg/dL (ref 0.50–1.05)
Globulin: 2.2 g/dL (calc) (ref 1.9–3.7)
Glucose, Bld: 93 mg/dL (ref 65–99)
Potassium: 4.1 mmol/L (ref 3.5–5.3)
Sodium: 139 mmol/L (ref 135–146)
Total Bilirubin: 0.7 mg/dL (ref 0.2–1.2)
Total Protein: 6.6 g/dL (ref 6.1–8.1)

## 2021-07-30 LAB — TSH: TSH: 2.82 mIU/L (ref 0.40–4.50)

## 2021-07-30 LAB — VITAMIN D 25 HYDROXY (VIT D DEFICIENCY, FRACTURES): Vit D, 25-Hydroxy: 39 ng/mL (ref 30–100)

## 2021-08-18 ENCOUNTER — Other Ambulatory Visit: Payer: Self-pay

## 2021-08-18 ENCOUNTER — Ambulatory Visit (INDEPENDENT_AMBULATORY_CARE_PROVIDER_SITE_OTHER): Payer: BC Managed Care – PPO | Admitting: Obstetrics & Gynecology

## 2021-08-18 ENCOUNTER — Telehealth: Payer: Self-pay | Admitting: *Deleted

## 2021-08-18 ENCOUNTER — Encounter: Payer: Self-pay | Admitting: Obstetrics & Gynecology

## 2021-08-18 VITALS — BP 118/76 | HR 63 | Resp 16 | Ht 62.75 in | Wt 127.0 lb

## 2021-08-18 DIAGNOSIS — Z780 Asymptomatic menopausal state: Secondary | ICD-10-CM

## 2021-08-18 DIAGNOSIS — Z01419 Encounter for gynecological examination (general) (routine) without abnormal findings: Secondary | ICD-10-CM

## 2021-08-18 DIAGNOSIS — M85852 Other specified disorders of bone density and structure, left thigh: Secondary | ICD-10-CM

## 2021-08-18 DIAGNOSIS — N8110 Cystocele, unspecified: Secondary | ICD-10-CM | POA: Diagnosis not present

## 2021-08-18 MED ORDER — ESTRADIOL 0.1 MG/GM VA CREA: TOPICAL_CREAM | VAGINAL | 4 refills | Status: DC

## 2021-08-18 NOTE — Telephone Encounter (Signed)
Pessary #6 Ring with Support ordered from [employer]. Patient will be notified once pessary is delivered to office. Per Dr. Dellis Filbert, no OV needed.

## 2021-08-18 NOTE — Addendum Note (Signed)
Addended by: Princess Bruins on: 08/18/2021 04:06 PM   Modules accepted: Orders

## 2021-08-18 NOTE — Progress Notes (Addendum)
Jacqueline Garrett 02-12-1951 [id number]   History:    71 y.o.  G2P2L2 Married   RP:  Established patient presenting for annual gyn exam    HPI: Postmenopause, well on no systemic HRT.  Uses vaginal Estradiol cream twice a week. No PMB.  No pelvic pain.  Well with Milex ring #6 with support for Cystocele grade 4/4.  Cleaning it herself weekly.  Noticed that the pessary is mildly "damaged" at the surface, would like to get a new pessary.  Pap Neg 06/2019.  Breasts normal.  Mammo Neg 03/2021.  BMI 22.68.  Will resume her regular walks.  Cared for her mother who passed away last year.  Osteopenia on BD in 2013.  Will schedule now at the [facility].  Colono 10/2019.  Health labs with Fam MD.     Past medical history,surgical history, family history and social history were all reviewed and documented in the EPIC chart.  Gynecologic History No LMP recorded (lmp unknown). Patient is postmenopausal.  Obstetric History OB History  Gravida Para Term Preterm AB Living  2 2       2   SAB IAB Ectopic Multiple Live Births               # Outcome Date GA Lbr Len/2nd Weight Sex Delivery Anes PTL Lv  2 Para           1 Para              ROS: A ROS was performed and pertinent positives and negatives are included in the history.  GENERAL: No fevers or chills. HEENT: No change in vision, no earache, sore throat or sinus congestion. NECK: No pain or stiffness. CARDIOVASCULAR: No chest pain or pressure. No palpitations. PULMONARY: No shortness of breath, cough or wheeze. GASTROINTESTINAL: No abdominal pain, nausea, vomiting or diarrhea, melena or bright red blood per rectum. GENITOURINARY: No urinary frequency, urgency, hesitancy or dysuria. MUSCULOSKELETAL: No joint or muscle pain, no back pain, no recent trauma. DERMATOLOGIC: No rash, no itching, no lesions. ENDOCRINE: No polyuria, polydipsia, no heat or cold intolerance. No recent change in weight. HEMATOLOGICAL: No anemia or easy bruising or  bleeding. NEUROLOGIC: No headache, seizures, numbness, tingling or weakness. PSYCHIATRIC: No depression, no loss of interest in normal activity or change in sleep pattern.     Exam:   BP 118/76    Pulse 63    Resp 16    Ht 5' 2.75" (1.594 m)    Wt 127 lb (57.6 kg)    LMP  (LMP Unknown)    BMI 22.68 kg/m   Body mass index is 22.68 kg/m.  General appearance : Well developed well nourished female. No acute distress HEENT: Eyes: no retinal hemorrhage or exudates,  Neck supple, trachea midline, no carotid bruits, no thyroidmegaly Lungs: Clear to auscultation, no rhonchi or wheezes, or rib retractions  Heart: Regular rate and rhythm, no murmurs or gallops Breast:Examined in sitting and supine position were symmetrical in appearance, no palpable masses or tenderness,  no skin retraction, no nipple inversion, no nipple discharge, no skin discoloration, no axillary or supraclavicular lymphadenopathy Abdomen: no palpable masses or tenderness, no rebound or guarding Extremities: no edema or skin discoloration or tenderness  Pelvic: Vulva: Normal             Vagina: No gross lesions or discharge.  Cystocele grade 4/4.  Cervix: No gross lesions or discharge  Uterus  AV, normal size, shape and consistency, non-tender and mobile  Adnexa  Without masses or tenderness  Anus: Normal   Assessment/Plan:  71 y.o. female for annual exam   1. Well female exam with routine gynecological exam Postmenopause, well on no systemic HRT. Uses vaginal Estradiol cream twice a week.  No PMB.  No pelvic pain.  Well with Milex ring #6 with support for Cystocele grade 4/4.  Cleaning it herself weekly.  Noticed that the pessary is mildly "damaged" at the surface, would like to get a new pessary.  Pap Neg 06/2019.  Breasts normal.  Mammo Neg 03/2021.  BMI 22.68.  Will resume her regular walks.  Cared for her mother who passed away last year.  Osteopenia on BD in 2013.  Will schedule now at the [facility].  Colono 10/2019.   Health labs with Fam MD.    2. Postmenopausal Postmenopause, well on no HRT.  No PMB.  No pelvic pain.  Vaginal Estradiol cream twice a week.  No CI to continue.  Prescription sent to pharmacy.  3. Osteopenia of neck of left femur Osteopenia on BD in 2013.  Will schedule now at the [facility].  Vit D supplement, Ca++ 1.2-1.5 g/d total, regular wt bearing physical activities. - DG Bone Density; Future  4. Baden-Walker grade 4 cystocele Well with Pessary Milex Ring #6 with support.  Will order a new one as the current one is deteriorating.  Other orders - Prednisol Ace-Moxiflox-Bromfen 1-0.5-0.075 % SUSP; Place 1 drop into the left eye 4 (four) times daily. (Patient not taking: Reported on 08/18/2021) - tretinoin (RETIN-A) 0.025 % cream; SMARTSIG:Sparingly Topical Every Other Day  - estradiol (ESTRACE) 0.1 MG/GM vaginal cream; Use 1 applicator cream twice weekly vaginally as needed.   Princess Bruins MD, 3:08 PM 08/18/2021

## 2021-09-14 NOTE — Telephone Encounter (Signed)
Item not received in office to date. Pessary on backorder, estimated arrival to [facility] 2/3.

## 2021-09-16 NOTE — Telephone Encounter (Signed)
Spoke with patient. Patient will come in the first part of next week to pick up pessary.

## 2021-09-16 NOTE — Telephone Encounter (Signed)
Pessary delivered to office.   Call placed to patient. Left message requesting return call to schedule pick up.   Pessary is in nursing supervisor office.

## 2021-09-30 NOTE — Telephone Encounter (Signed)
Left message to call Shafer Swamy, RN at [facility], [phone], OPT 5.  

## 2021-10-01 DIAGNOSIS — N812 Incomplete uterovaginal prolapse: Secondary | ICD-10-CM | POA: Diagnosis not present

## 2021-10-04 NOTE — Telephone Encounter (Signed)
Patient picked up pessary on 10/01/21.  Charge entered.   Routing to provider for final review. Patient is agreeable to disposition. Will close encounter.

## 2021-12-11 IMAGING — MG DIGITAL SCREENING BILAT W/ CAD
4 series · 4 of 4 positions shown · non-contrast
Comparison: Previous exam(s).

CLINICAL DATA: Screening.

EXAM:
DIGITAL SCREENING BILATERAL MAMMOGRAM WITH CAD

[L CC]
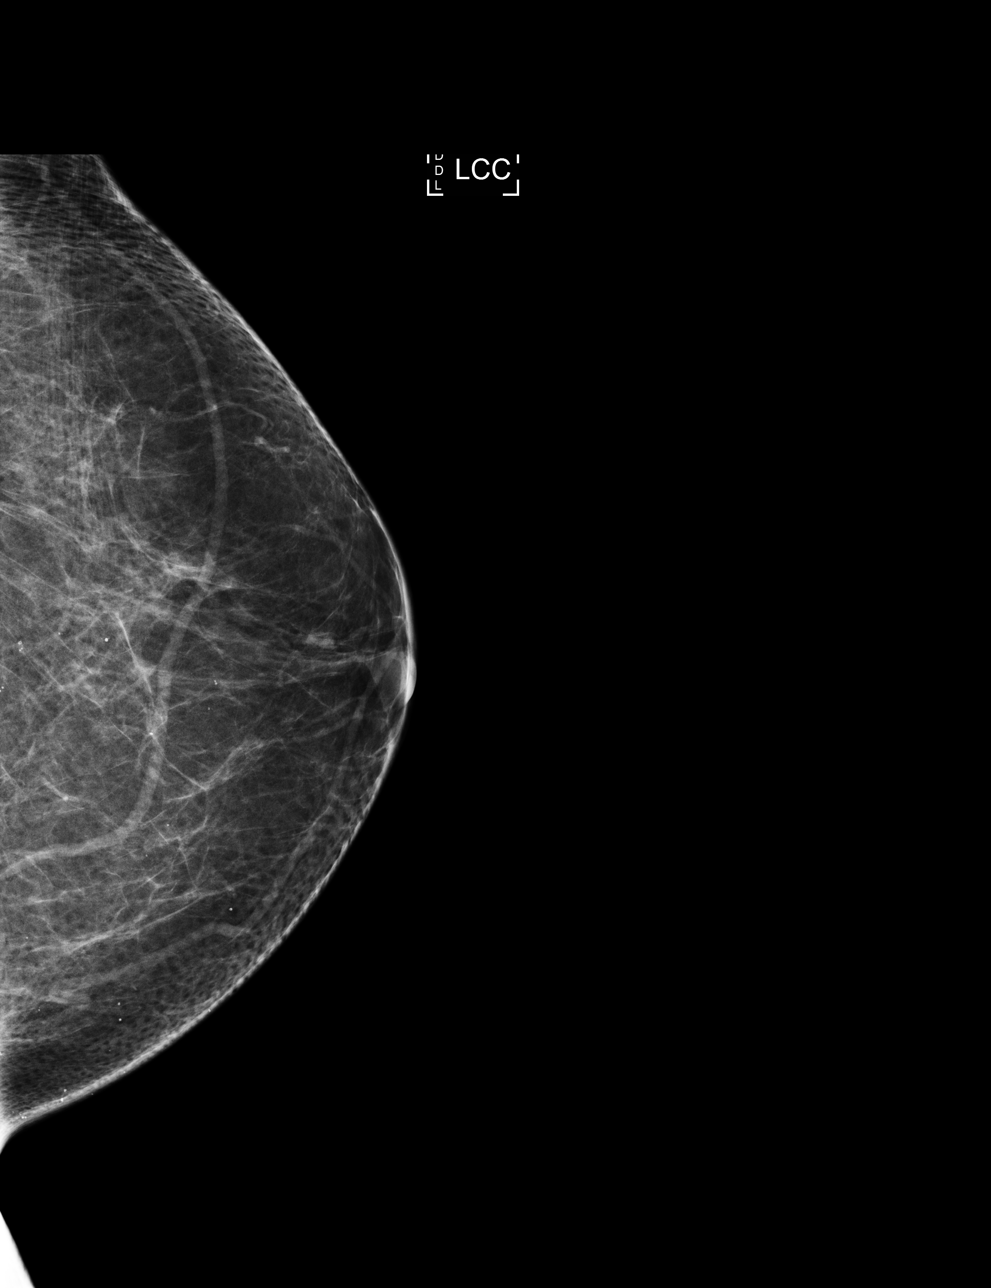

[L MLO]
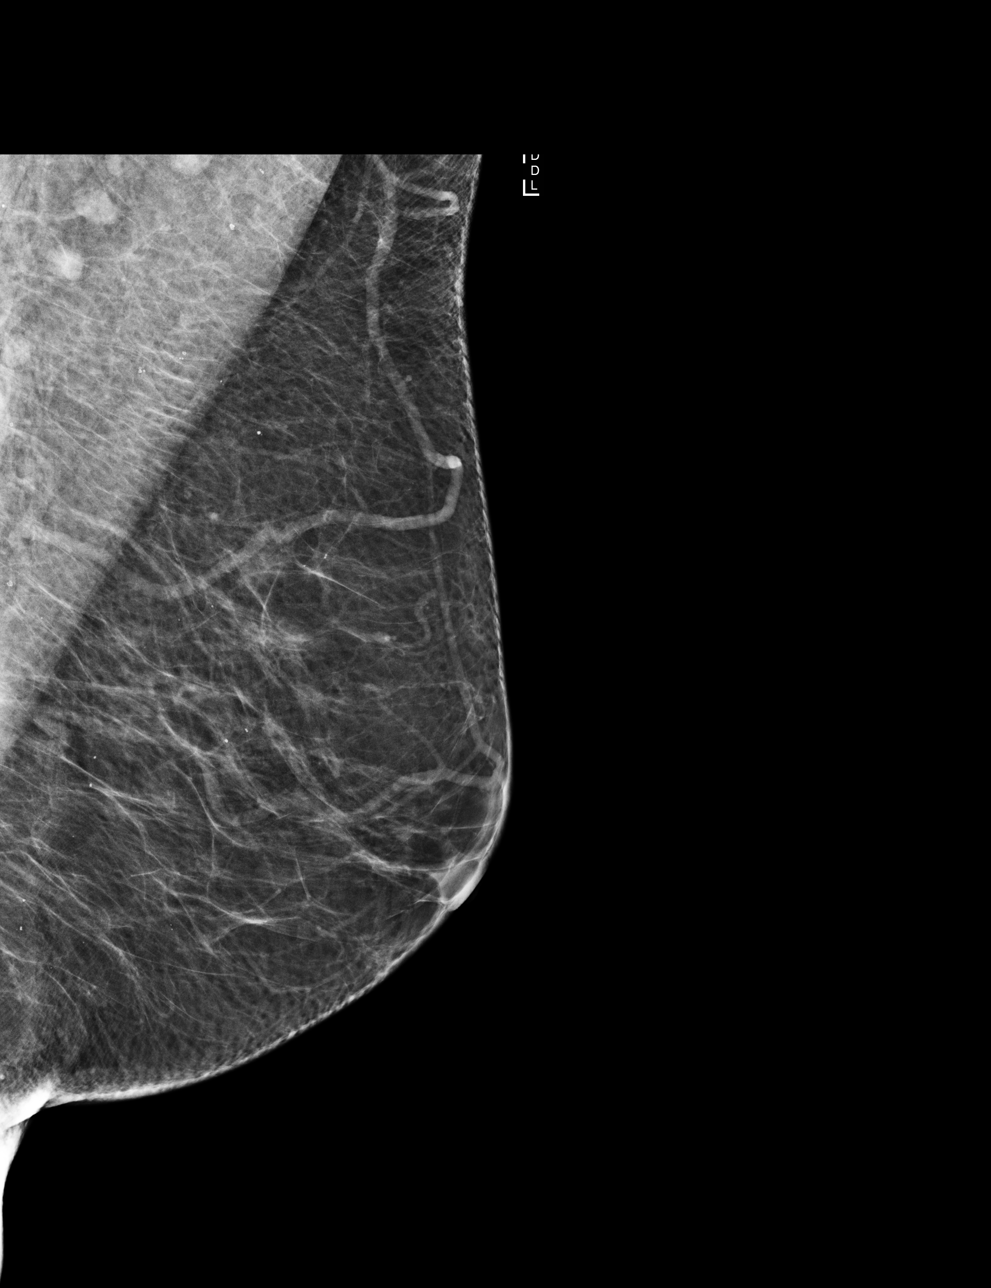

[R CC]
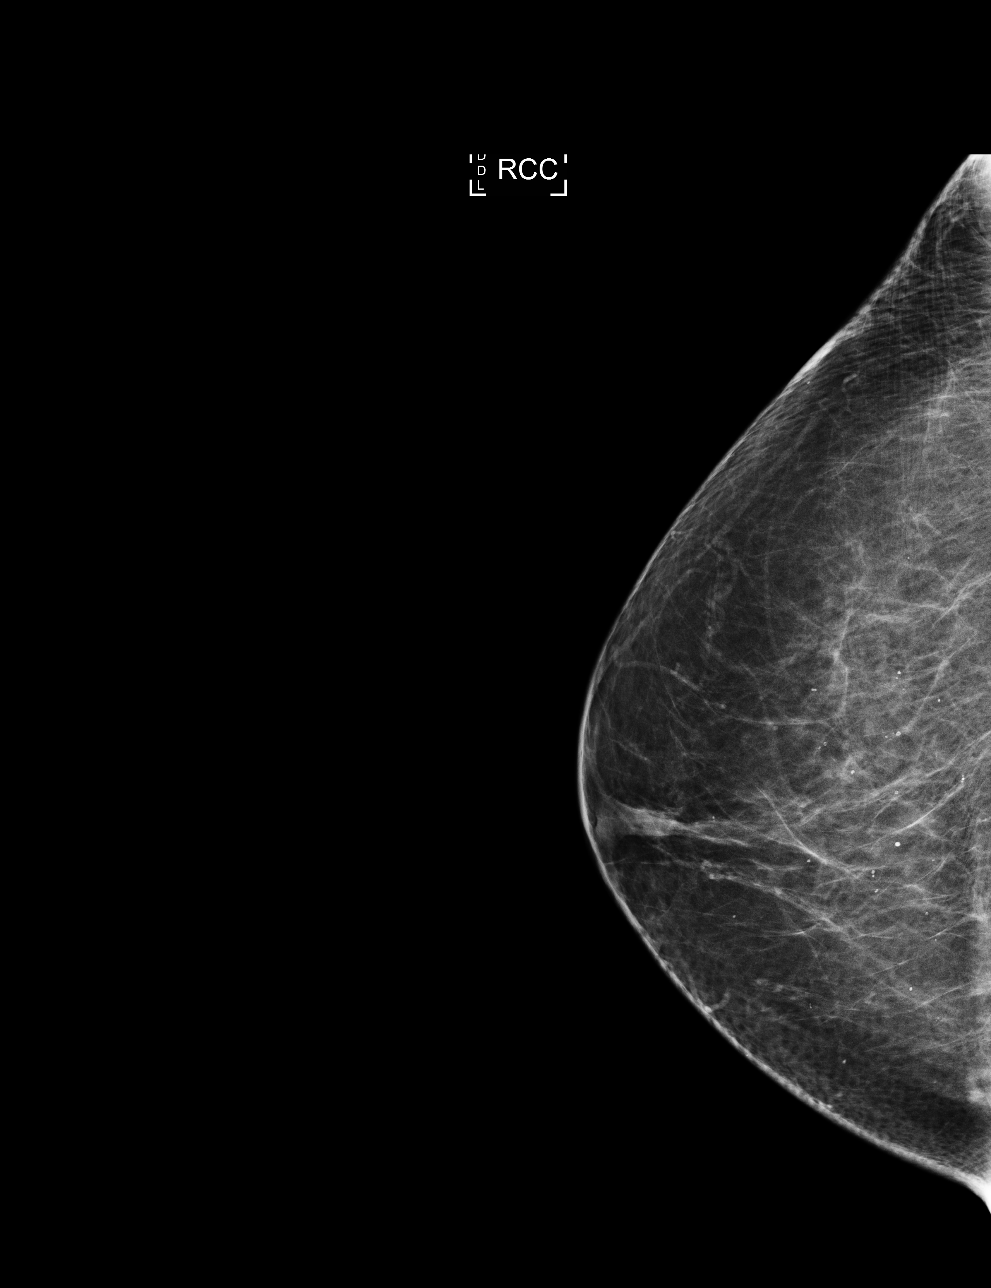

[R MLO]
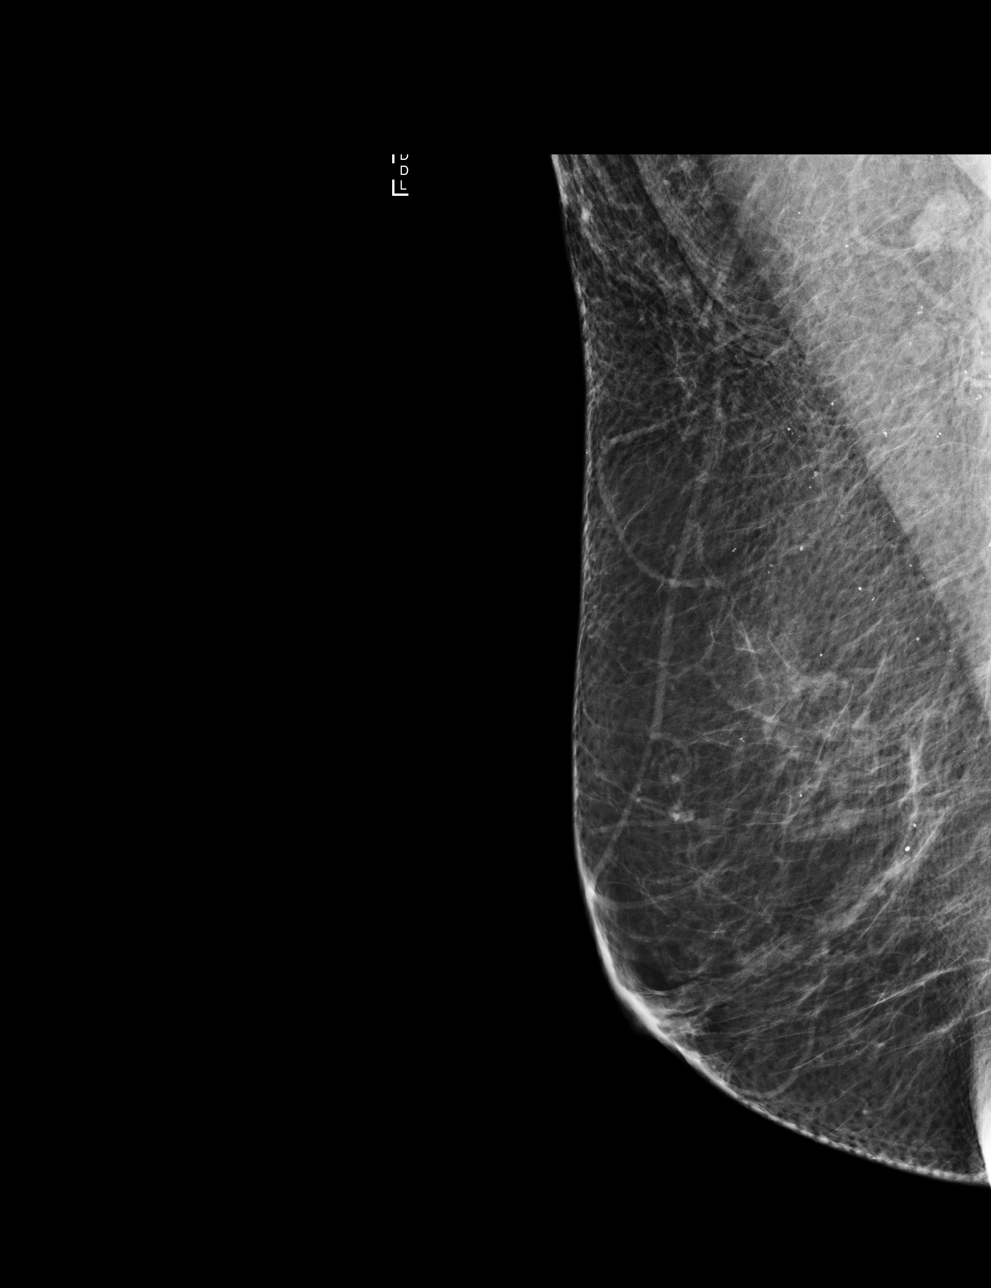

[4 of 4 positions shown; findings below may reference images not displayed]

ACR Breast Density Category b: There are scattered areas of
fibroglandular density.
FINDINGS: There are no findings suspicious for malignancy. Images were
processed with CAD.
IMPRESSION: No mammographic evidence of malignancy. A result letter of this
screening mammogram will be mailed directly to the patient.

RECOMMENDATION:
Screening mammogram in one year. (Code:AS-G-LCT)

BI-RADS CATEGORY  1: Negative.

## 2022-02-21 ENCOUNTER — Other Ambulatory Visit: Payer: Self-pay | Admitting: Obstetrics & Gynecology

## 2022-02-21 DIAGNOSIS — Z1231 Encounter for screening mammogram for malignant neoplasm of breast: Secondary | ICD-10-CM

## 2022-02-24 ENCOUNTER — Ambulatory Visit
Admission: RE | Admit: 2022-02-24 | Discharge: 2022-02-24 | Disposition: A | Discharge status: Home / Self Care | Payer: Self-pay | Source: Ambulatory Visit | Attending: Obstetrics & Gynecology | Admitting: Obstetrics & Gynecology

## 2022-02-24 DIAGNOSIS — M85852 Other specified disorders of bone density and structure, left thigh: Secondary | ICD-10-CM

## 2022-03-17 ENCOUNTER — Ambulatory Visit
Admission: RE | Admit: 2022-03-17 | Discharge: 2022-03-17 | Disposition: A | Discharge status: Home / Self Care | Payer: PPO | Source: Ambulatory Visit | Attending: Obstetrics & Gynecology | Admitting: Obstetrics & Gynecology

## 2022-03-17 DIAGNOSIS — Z1231 Encounter for screening mammogram for malignant neoplasm of breast: Secondary | ICD-10-CM

## 2022-10-12 ENCOUNTER — Telehealth: Payer: Self-pay

## 2022-10-12 NOTE — Telephone Encounter (Signed)
FYI. Pt called today reporting that she didn't recall getting notified of results/recommendations for imaging done in Summer of 2023 for DEXA scan and/or mammogram.  Called pt back, no answer, left DVM per DPR advising pt that we notified her of DEXA results/recommendations via mychart and notified her again of them and also advised her that her mammo was WNL and that usually [facility] will send out pt letters for all imaging that is WNL.   Advised pt to cb and/or send [place] a mychart msg if any additional questions.   Will route to provider for final review and close encounter.

## 2022-12-14 ENCOUNTER — Ambulatory Visit: Payer: PPO | Admitting: Obstetrics & Gynecology

## 2023-01-05 ENCOUNTER — Encounter: Payer: Self-pay | Admitting: Obstetrics & Gynecology

## 2023-01-11 ENCOUNTER — Ambulatory Visit (INDEPENDENT_AMBULATORY_CARE_PROVIDER_SITE_OTHER): Payer: PPO | Admitting: Obstetrics & Gynecology

## 2023-01-11 ENCOUNTER — Encounter: Payer: Self-pay | Admitting: Obstetrics & Gynecology

## 2023-01-11 ENCOUNTER — Other Ambulatory Visit (HOSPITAL_COMMUNITY)
Admission: RE | Admit: 2023-01-11 | Discharge: 2023-01-11 | Disposition: A | Discharge status: Home / Self Care | Payer: PPO | Source: Ambulatory Visit | Attending: Obstetrics & Gynecology | Admitting: Obstetrics & Gynecology

## 2023-01-11 VITALS — BP 112/78 | HR 75 | Ht 62.75 in | Wt 127.0 lb

## 2023-01-11 DIAGNOSIS — Z01419 Encounter for gynecological examination (general) (routine) without abnormal findings: Secondary | ICD-10-CM | POA: Insufficient documentation

## 2023-01-11 DIAGNOSIS — M85852 Other specified disorders of bone density and structure, left thigh: Secondary | ICD-10-CM | POA: Diagnosis not present

## 2023-01-11 DIAGNOSIS — Z1151 Encounter for screening for human papillomavirus (HPV): Secondary | ICD-10-CM | POA: Insufficient documentation

## 2023-01-11 DIAGNOSIS — N8110 Cystocele, unspecified: Secondary | ICD-10-CM

## 2023-01-11 DIAGNOSIS — Z13220 Encounter for screening for lipoid disorders: Secondary | ICD-10-CM

## 2023-01-11 DIAGNOSIS — N952 Postmenopausal atrophic vaginitis: Secondary | ICD-10-CM

## 2023-01-11 DIAGNOSIS — Z1329 Encounter for screening for other suspected endocrine disorder: Secondary | ICD-10-CM

## 2023-01-11 DIAGNOSIS — N958 Other specified menopausal and perimenopausal disorders: Secondary | ICD-10-CM

## 2023-01-11 DIAGNOSIS — Z1321 Encounter for screening for nutritional disorder: Secondary | ICD-10-CM

## 2023-01-11 DIAGNOSIS — Z780 Asymptomatic menopausal state: Secondary | ICD-10-CM

## 2023-01-11 MED ORDER — TRIMO-SAN 0.025 % VA GEL: 1.0000 | VAGINAL | 4 refills | Status: DC | PRN

## 2023-01-11 MED ORDER — ESTRADIOL 0.1 MG/GM VA CREA: TOPICAL_CREAM | VAGINAL | 4 refills | Status: DC

## 2023-01-11 NOTE — Progress Notes (Signed)
Che Enamorado Jan 04, 1951 [id number]   History:    72 y.o. G2P2L2 Married   RP:  Established patient presenting for annual gyn exam    HPI: Postmenopause, well on no systemic HRT.  Uses vaginal Estradiol cream once a week. No PMB.  No pelvic pain.  Well with Milex ring #6 with support for Cystocele grade 4/4.  Cleaning it herself weekly. Pap Neg 06/2019. Pap reflex today.  Breasts normal.  Mammo Neg 03/2022.  BMI 22.68.  Will resume her regular walks. BD Osteopenia -1.8 in 02/2022.  Colono 10/2019.  Health labs with Fam MD.     Past medical history,surgical history, family history and social history were all reviewed and documented in the EPIC chart.  Gynecologic History No LMP recorded (lmp unknown). Patient is postmenopausal.  Obstetric History OB History  Gravida Para Term Preterm AB Living  2 2 2     2   SAB IAB Ectopic Multiple Live Births               # Outcome Date GA Lbr Len/2nd Weight Sex Delivery Anes PTL Lv  2 Term           1 Term              ROS: A ROS was performed and pertinent positives and negatives are included in the history. GENERAL: No fevers or chills. HEENT: No change in vision, no earache, sore throat or sinus congestion. NECK: No pain or stiffness. CARDIOVASCULAR: No chest pain or pressure. No palpitations. PULMONARY: No shortness of breath, cough or wheeze. GASTROINTESTINAL: No abdominal pain, nausea, vomiting or diarrhea, melena or bright red blood per rectum. GENITOURINARY: No urinary frequency, urgency, hesitancy or dysuria. MUSCULOSKELETAL: No joint or muscle pain, no back pain, no recent trauma. DERMATOLOGIC: No rash, no itching, no lesions. ENDOCRINE: No polyuria, polydipsia, no heat or cold intolerance. No recent change in weight. HEMATOLOGICAL: No anemia or easy bruising or bleeding. NEUROLOGIC: No headache, seizures, numbness, tingling or weakness. PSYCHIATRIC: No depression, no loss of interest in normal activity or change in sleep pattern.      Exam:   BP 112/78   Pulse 75   Ht 5' 2.75" (1.594 m)   Wt 127 lb (57.6 kg)   LMP  (LMP Unknown) Comment: sexually active  SpO2 98%   BMI 22.68 kg/m   Body mass index is 22.68 kg/m.  General appearance : Well developed well nourished female. No acute distress HEENT: Eyes: no retinal hemorrhage or exudates,  Neck supple, trachea midline, no carotid bruits, no thyroidmegaly Lungs: Clear to auscultation, no rhonchi or wheezes, or rib retractions  Heart: Regular rate and rhythm, no murmurs or gallops Breast:Examined in sitting and supine position were symmetrical in appearance, no palpable masses or tenderness,  no skin retraction, no nipple inversion, no nipple discharge, no skin discoloration, no axillary or supraclavicular lymphadenopathy Abdomen: no palpable masses or tenderness, no rebound or guarding Extremities: no edema or skin discoloration or tenderness  Pelvic: Vulva: Normal             Vagina: No gross lesions or discharge. Pessary removed and cleaned.  Put back in place after the exam.  Cystocele grade 4/4.  Cervix: No gross lesions or discharge.  Pap reflex done.  Uterus  AV, normal size, shape and consistency, non-tender and mobile  Adnexa  Without masses or tenderness  Anus: Normal   Assessment/Plan:  71 y.o. female for annual exam   1. Encounter for routine gynecological  examination with Papanicolaou smear of cervix Postmenopause, well on no systemic HRT.  Uses vaginal Estradiol cream once a week. No PMB.  No pelvic pain.  Well with Milex ring #6 with support for Cystocele grade 4/4.  Cleaning it herself weekly. Pap Neg 06/2019. Pap reflex today.  Breasts normal.  Mammo Neg 03/2022.  BMI 22.68.  Will resume her regular walks. BD Osteopenia -1.8 in 02/2022.  Colono 10/2019.  Health labs with Fam MD.   - Cytology - PAP( [facility]) - CBC; Future - Comp Met (CMET); Future  2. Postmenopausal Postmenopause, well on no systemic HRT.  Uses vaginal Estradiol cream once a  week. No PMB.  No pelvic pain.  3. Genitourinary syndrome of menopause Well on Estradiol cream. No CI to continue.  Prescription sent to pharmacy.  Recommend Replens as needed as well.  4. Osteopenia of neck of left femur BD Osteopenia -1.8 in 02/2022. Continue on Ca++ and Vit D.  5. Baden-Walker grade 4 cystocele Well with Milex ring #6 with support for Cystocele grade 4/4.  Cleaning it herself weekly.   6. Screening cholesterol level - Lipid Profile; Future  7. Screening for thyroid disorder - TSH; Future  8. Encounter for vitamin deficiency screening - Vitamin D (25 hydroxy); Future  Other orders - Calcium Citrate-Vitamin D (CALCIUM + D PO); Take by mouth. - Multiple Vitamin (MULTIVITAMIN PO); Take by mouth. - MAGNESIUM PO; Take by mouth. - Omega-3 Fatty Acids (FISH OIL PO); Take by mouth. - estradiol (ESTRACE) 0.1 MG/GM vaginal cream; Use 1 applicator cream weekly vaginally as needed. - OXYQUINOLONE SULFATE VAGINAL (TRIMO-SAN) 0.025 % GEL; Place 1 Applicatorful vaginally as needed. Place 1/2 applicator vaginally as needed.   Genia Del MD, 2:13 PM

## 2023-01-12 ENCOUNTER — Telehealth: Payer: Self-pay

## 2023-01-12 NOTE — Telephone Encounter (Signed)
[facility] fax received for prescription sent today for Trimo-San Gel 113.4gm.  RX had 2 different directions and they needed clarification.  Spoke to pharmacist and notified:  Per Dr. Seymour Bars:  place 1/2 applicator full vaginally as needed.

## 2023-01-12 NOTE — Telephone Encounter (Addendum)
Prior authorization sent to cover my meds. Waiting on response.  Key: [id number]   Prior authorization was denied.  I sent in an appeal. Patient is aware that a response can take up to 72 hrs.  I called patient today to let her know that I am still waiting on a response to the appeal I sent in. Patient is aware it may take a week from when I sent it. She is appreciative that I let her know.

## 2023-01-12 NOTE — Telephone Encounter (Signed)
Trimo-San gel 0.025 needed a prior authorization. I called insurance & waiting on faxed form. Patient is aware

## 2023-01-19 ENCOUNTER — Other Ambulatory Visit: Payer: PPO

## 2023-01-19 DIAGNOSIS — Z13220 Encounter for screening for lipoid disorders: Secondary | ICD-10-CM

## 2023-01-19 DIAGNOSIS — Z01419 Encounter for gynecological examination (general) (routine) without abnormal findings: Secondary | ICD-10-CM

## 2023-01-19 DIAGNOSIS — Z1329 Encounter for screening for other suspected endocrine disorder: Secondary | ICD-10-CM

## 2023-01-19 DIAGNOSIS — Z1321 Encounter for screening for nutritional disorder: Secondary | ICD-10-CM

## 2023-01-20 ENCOUNTER — Telehealth: Payer: Self-pay

## 2023-01-20 LAB — COMPREHENSIVE METABOLIC PANEL
AG Ratio: 1.7 (calc) (ref 1.0–2.5)
ALT: 17 U/L (ref 6–29)
AST: 22 U/L (ref 10–35)
Albumin: 4.3 g/dL (ref 3.6–5.1)
Alkaline phosphatase (APISO): 59 U/L (ref 37–153)
BUN: 14 mg/dL (ref 7–25)
CO2: 23 mmol/L (ref 20–32)
Calcium: 9.2 mg/dL (ref 8.6–10.4)
Chloride: 101 mmol/L (ref 98–110)
Creat: 0.87 mg/dL (ref 0.60–1.00)
Globulin: 2.5 g/dL (calc) (ref 1.9–3.7)
Glucose, Bld: 96 mg/dL (ref 65–99)
Potassium: 4 mmol/L (ref 3.5–5.3)
Sodium: 138 mmol/L (ref 135–146)
Total Bilirubin: 0.6 mg/dL (ref 0.2–1.2)
Total Protein: 6.8 g/dL (ref 6.1–8.1)

## 2023-01-20 LAB — LIPID PANEL
Cholesterol: 218 mg/dL — ABNORMAL HIGH (ref <200)
HDL: 77 mg/dL (ref >=50)
LDL Cholesterol (Calc): 118 mg/dL (calc) — ABNORMAL HIGH
Non-HDL Cholesterol (Calc): 141 mg/dL (calc) — ABNORMAL HIGH (ref <130)
Total CHOL/HDL Ratio: 2.8 (calc) (ref <5.0)
Triglycerides: 120 mg/dL (ref <150)

## 2023-01-20 LAB — CBC
HCT: 44.6 % (ref 35.0–45.0)
Hemoglobin: 14.9 g/dL (ref 11.7–15.5)
MCH: 31.6 pg (ref 27.0–33.0)
MCHC: 33.4 g/dL (ref 32.0–36.0)
MCV: 94.7 fL (ref 80.0–100.0)
MPV: 10.2 fL (ref 7.5–12.5)
Platelets: 242 10*3/uL (ref 140–400)
RBC: 4.71 10*6/uL (ref 3.80–5.10)
RDW: 12.5 % (ref 11.0–15.0)
WBC: 4.5 10*3/uL (ref 3.8–10.8)

## 2023-01-20 LAB — VITAMIN D 25 HYDROXY (VIT D DEFICIENCY, FRACTURES): Vit D, 25-Hydroxy: 40 ng/mL (ref 30–100)

## 2023-01-20 LAB — TSH: TSH: 3.55 mIU/L (ref 0.40–4.50)

## 2023-01-20 NOTE — Telephone Encounter (Signed)
Per ML: "Yes, add HPV HR."

## 2023-01-20 NOTE — Telephone Encounter (Signed)
Pt LVM x2 in triage line inquiring about results of pap test. I called her back and advised her that Dr. Seymour Bars has not yet had the chance to review her results and provide recommendations. But as soon as we know something, she will know and we appreciate her patience in the mean time. She voiced understanding.

## 2023-01-20 NOTE — Telephone Encounter (Signed)
Pt LVM in triage line requesting results/recommendations from abn pap result seen in mychart.   P 01/11/2023-ASCUS  Would you like me to send request add-on HR HPV to [facility]? Please advise.

## 2023-01-23 NOTE — Telephone Encounter (Signed)
Spoke to patient today to let her know I am still waiting on prior authorization response. If I don't hear anything by Monday & will call [employer].

## 2023-01-25 LAB — CYTOLOGY - PAP
Comment: NEGATIVE
Diagnosis: UNDETERMINED — AB
High risk HPV: NEGATIVE

## 2023-01-25 NOTE — Telephone Encounter (Signed)
I called [employer] & they told me that the appeal for the trimo-san was denied. Patient is aware. She has an appointment with Dr Seymour Bars tomorrow. I told her that I can print a goodrx card for her tomorrow that she can take to the pharmacy if they don't come up with a different plan.

## 2023-01-26 ENCOUNTER — Ambulatory Visit: Payer: PPO | Admitting: Obstetrics & Gynecology

## 2023-01-26 ENCOUNTER — Encounter: Payer: Self-pay | Admitting: Obstetrics & Gynecology

## 2023-01-26 VITALS — BP 118/80 | HR 74 | Temp 98.3°F

## 2023-01-26 DIAGNOSIS — R102 Pelvic and perineal pain: Secondary | ICD-10-CM | POA: Diagnosis not present

## 2023-01-26 DIAGNOSIS — N949 Unspecified condition associated with female genital organs and menstrual cycle: Secondary | ICD-10-CM

## 2023-01-26 DIAGNOSIS — N8110 Cystocele, unspecified: Secondary | ICD-10-CM | POA: Diagnosis not present

## 2023-01-26 DIAGNOSIS — B3731 Acute candidiasis of vulva and vagina: Secondary | ICD-10-CM

## 2023-01-26 LAB — WET PREP FOR TRICH, YEAST, CLUE

## 2023-01-26 MED ORDER — FLUCONAZOLE 150 MG PO TABS: 150.0000 mg | ORAL_TABLET | ORAL | 0 refills | Status: AC

## 2023-01-26 NOTE — Progress Notes (Signed)
    Jacqueline Garrett 08/04/1951 [id number]        72 y.o.  G2P2002   RP: Pelvic pressure & vaginal burning   HPI: Patient c/o pelvic pressure & vaginal burning, using the pessary.   Postmenopause, well on no systemic HRT.  Uses vaginal Estradiol cream once a week. No PMB.  No pelvic pain.  Well with Milex ring #6 with support for Cystocele grade 4/4.  Cleaning it herself weekly. Using Trimo-San, which is no longer covered by her insurance.   OB History  Gravida Para Term Preterm AB Living  2 2 2     2   SAB IAB Ectopic Multiple Live Births               # Outcome Date GA Lbr Len/2nd Weight Sex Delivery Anes PTL Lv  2 Term           1 Term             Past medical history,surgical history, problem list, medications, allergies, family history and social history were all reviewed and documented in the EPIC chart.   Directed ROS with pertinent positives and negatives documented in the history of present illness/assessment and plan.  Exam:  Vitals:   01/26/23 1104  BP: 118/80  Pulse: 74  Temp: 98.3 F (36.8 C)  TempSrc: Oral  SpO2: 97%   General appearance:  Normal  Abdomen: Normal  Gynecologic exam: Vulva normal.  Pessary removed and cleaned.  Thick whitish d/c.  Wet prep done.  Cystocele grade 4/4.  Mucosa intact.  Bimanual exam: Uterus AV, normal volume, mobile, NT.  No adnexal mass, NT.  U/A: Yellow clear, protein negative, nitrites negative, white blood cells 0-5, red blood cells 0-2, bacteria negative.  Urine culture pending. Wet prep:  Yeasts present   Assessment/Plan:  72 y.o. G2P2002   1. Pelvic pressure in female Patient c/o pelvic pressure & vaginal burning, using the pessary.  Postmenopause, well on no systemic HRT.  Uses vaginal Estradiol cream once a week. No PMB.  No pelvic pain.  Well with Milex ring #6 with support for Cystocele grade 4/4.  Cleaning it herself weekly. Using Trimo-San, which is no longer covered by her insurance.  Normal  Gynecologic exam, except for Yeast Vaginitis confirmed by Wet prep and stable Cystocele 4/4.  Will stop Estradiol cream and Trimo-San and use Replens instead.  Change of pessary discussed.  Possible eventual Anterior repair/Sling procedure with Uro-Gyn, Dr Lanetta Inch discussed. - Urinalysis,Complete w/RFL Culture  2. Vaginal burning Yeast Vaginitis confirmed by Wet prep.  Will treat with Fluconazole 150 mg 1 tab every other day x 3.  Stop using Estradiol cream and Trimo-San.  Will change to Replens to insert the Pessary after finishing the Yeast treatment. - WET PREP FOR TRICH, YEAST, CLUE  3. Baden-Walker grade 4 cystocele Will continue with the Milex Ring with Support #6 after treating the Yeast vaginitis. May call back if the discomfort persists to change pessary.  Other orders - fluconazole (DIFLUCAN) 150 MG tablet; Take 1 tablet (150 mg total) by mouth every other day for 3 doses.   Genia Del MD, 11:18 AM 01/26/2023

## 2023-01-28 LAB — URINALYSIS, COMPLETE W/RFL CULTURE
Bacteria, UA: NONE SEEN /HPF
Bilirubin Urine: NEGATIVE
Glucose, UA: NEGATIVE
Hyaline Cast: NONE SEEN /LPF
Ketones, ur: NEGATIVE
Leukocyte Esterase: NEGATIVE
Nitrites, Initial: NEGATIVE
Protein, ur: NEGATIVE
Specific Gravity, Urine: 1.01 (ref 1.001–1.035)
pH: 6 (ref 5.0–8.0)

## 2023-01-28 LAB — URINE CULTURE
MICRO NUMBER:: 15078878
SPECIMEN QUALITY:: ADEQUATE

## 2023-01-28 LAB — CULTURE INDICATED

## 2023-02-01 ENCOUNTER — Other Ambulatory Visit: Payer: Self-pay | Admitting: Obstetrics & Gynecology

## 2023-02-01 ENCOUNTER — Telehealth: Payer: Self-pay | Admitting: *Deleted

## 2023-02-01 MED ORDER — NYSTATIN-TRIAMCINOLONE 100000-0.1 UNIT/GM-% EX OINT: 1.0000 | TOPICAL_OINTMENT | Freq: Two times a day (BID) | CUTANEOUS | 3 refills | Status: AC

## 2023-02-01 NOTE — Telephone Encounter (Signed)
Genia Del, MD  You2 hours ago (1:45 PM)    I decided Mycolog would be actually better and already sent the prescription. Thanks, Dr. Elbert Ewings  Patient notified.   Patient is concerned medication will come in contact with bladder when pessary not in use. Reviewed symptoms with patient that she reported in previous call, states symptoms are inner labia/into opening of vagina wants to confirm ok to use medication if bladder comes in contact. Advised I will send to Dr. Seymour Bars and f/u. Patient agreeable.

## 2023-02-01 NOTE — Telephone Encounter (Signed)
Spoke with patient, seen in office on 6/13, tx for yeast with diflucan q other day x3 doses. Completed medication, reports symptoms improved, not resolved. Reports itching and burning vulvar/labia area. Denies vaginal d/c or odor. States she is applying yogurt for relief. Has tried coconut oil, makes it worse. States symptoms were present 6 weeks prior to treatment. Advised I will review with Dr. Seymour Bars and our office will f/u with recommendations, patient agreeable.   Dr. Seymour Bars -please review and advise.

## 2023-02-01 NOTE — Telephone Encounter (Signed)
Genia Del, MD  You16 minutes ago (4:18 PM)    Yes, no problem.  Please remind her that her bladder is on the other side of the vagina, so it will not be in contact with the ointment. Dr. Elbert Ewings    Patient notified.   Encounter closed.

## 2023-02-01 NOTE — Telephone Encounter (Signed)
Genia Del, MD  You53 minutes ago (12:43 PM)    Please send a prescription of Kenalog ointment to apply on the affected vulva twice a day x 2 weeks, then as needed. Dr. Elbert Ewings  Dr. Seymour Bars -can you confirm the strength?

## 2023-02-01 NOTE — Progress Notes (Signed)
Vulvar itching post Fluconazole treatment.  Mycolog ointment 2x a day x 2 weeks, then as needed.

## 2023-02-20 ENCOUNTER — Telehealth: Payer: Self-pay

## 2023-02-20 NOTE — Telephone Encounter (Signed)
Spoke to pt via phone call regarding VM. Pt explained reoccurring yeast inf. Reported Mycolog ointment is no longer working. Pt has burning and green discharge with and without pessary. Pt stated she is very uncomfortable and was unsure of if she could receive antibiotic.   Advised pt to have OV. Front desk will schedule with pt.

## 2023-02-21 ENCOUNTER — Other Ambulatory Visit: Payer: Self-pay | Admitting: Obstetrics & Gynecology

## 2023-02-21 DIAGNOSIS — Z1231 Encounter for screening mammogram for malignant neoplasm of breast: Secondary | ICD-10-CM

## 2023-02-22 ENCOUNTER — Ambulatory Visit: Payer: PPO | Admitting: Obstetrics & Gynecology

## 2023-02-22 ENCOUNTER — Encounter: Payer: Self-pay | Admitting: Obstetrics & Gynecology

## 2023-02-22 VITALS — BP 124/84

## 2023-02-22 DIAGNOSIS — B3731 Acute candidiasis of vulva and vagina: Secondary | ICD-10-CM | POA: Diagnosis not present

## 2023-02-22 DIAGNOSIS — N8110 Cystocele, unspecified: Secondary | ICD-10-CM

## 2023-02-22 DIAGNOSIS — N949 Unspecified condition associated with female genital organs and menstrual cycle: Secondary | ICD-10-CM

## 2023-02-22 DIAGNOSIS — N958 Other specified menopausal and perimenopausal disorders: Secondary | ICD-10-CM | POA: Diagnosis not present

## 2023-02-22 LAB — WET PREP FOR TRICH, YEAST, CLUE

## 2023-02-22 NOTE — Progress Notes (Signed)
    Xylah Pollina 08/12/1951 [id number]        72 y.o.  G2P2L2   RP: Vaginal burning and discharge  HPI: Seen on 01/26/23 and treated with Fluconazole for a Yeast infection.  Improved with treatment, but now vaginal burning and discharge have returned.  Postmenopause, well on no systemic HRT.  Uses vaginal Estradiol cream once a week. No PMB. No pelvic pain.  Well with Milex ring #6 with support for Cystocele grade 4/4.  Cleaning it herself weekly. Was using Trimo-San, now planning to use Replens with pessary.     OB History  Gravida Para Term Preterm AB Living  2 2 2     2   SAB IAB Ectopic Multiple Live Births               # Outcome Date GA Lbr Len/2nd Weight Sex Delivery Anes PTL Lv  2 Term           1 Term             Past medical history,surgical history, problem list, medications, allergies, family history and social history were all reviewed and documented in the EPIC chart.   Directed ROS with pertinent positives and negatives documented in the history of present illness/assessment and plan.  Exam:  Vitals:   02/22/23 0954  BP: 124/84   General appearance:  Normal  Gynecologic exam: Vulva normal.  Speculum:  Cervix/Vagina normal.  Mild white discharge.  Wet prep and SureSwab Advanced done.  Wet prep Negative   Assessment/Plan:  72 y.o. G2P2002   1. Vaginal burning Seen on 01/26/23 and treated with Fluconazole for a Yeast infection.  Improved with treatment, but now vaginal burning and discharge have returned.  Postmenopause, well on no systemic HRT.  Uses vaginal Estradiol cream once a week. No PMB. No pelvic pain.  Well with Milex ring #6 with support for Cystocele grade 4/4.  Cleaning it herself weekly. Was using Trimo-San, now planning to use Replens with pessary.    Wet prep Negative.  Possible irritation for the pessary discharge or from using Estradiol cream.  Restart Mycolog every other day.  May stop Estradiol cream for a few weeks. Pending SureSwab  Advanced. - WET PREP FOR TRICH, YEAST, CLUE - SureSwab Advanced Vaginitis, TMA  2. Genitourinary syndrome of menopause Will stop Estradiol cream for a couple of weeks.  3. Baden-Walker grade 4 cystocele Continue with pessary at this time, but keep it out for the next 2 weeks.  Eventual surgical correction to consider.  Patient will contact Dr Florian Buff when ready.  Other orders - TRIMO-SAN 0.025-0.01 % GEL; SMARTSIG:0.5 Applicator Vaginal PRN   Genia Del MD, 9:58 AM 02/22/2023

## 2023-02-23 LAB — SURESWAB® ADVANCED VAGINITIS,TMA
CANDIDA SPECIES: DETECTED — AB
Candida glabrata: NOT DETECTED
SURESWAB(R) ADV BACTERIAL VAGINOSIS(BV),TMA: NEGATIVE
TRICHOMONAS VAGINALIS (TV),TMA: NOT DETECTED

## 2023-02-27 MED ORDER — TERCONAZOLE 0.8 % VA CREA: 1.0000 | TOPICAL_CREAM | Freq: Every day | VAGINAL | 0 refills | Status: DC

## 2023-02-27 NOTE — Addendum Note (Signed)
Addended by: Melrose Nakayama on: 02/27/2023 02:32 PM   Modules accepted: Orders

## 2023-03-01 NOTE — Telephone Encounter (Signed)
HPV results were added. Results returned and pt was notified per result note from 01/11/2023. Will close encounter.

## 2023-03-20 ENCOUNTER — Ambulatory Visit: Payer: PPO

## 2023-03-22 ENCOUNTER — Ambulatory Visit
Admission: RE | Admit: 2023-03-22 | Discharge: 2023-03-22 | Disposition: A | Discharge status: Home / Self Care | Payer: PPO | Source: Ambulatory Visit | Attending: Obstetrics & Gynecology | Admitting: Obstetrics & Gynecology

## 2023-03-22 DIAGNOSIS — Z1231 Encounter for screening mammogram for malignant neoplasm of breast: Secondary | ICD-10-CM

## 2023-03-24 IMAGING — MG DIGITAL SCREENING BILAT W/ CAD
4 series · 4 of 4 positions shown · non-contrast
Comparison: Previous exam(s).

CLINICAL DATA: Screening.

EXAM:
DIGITAL SCREENING BILATERAL MAMMOGRAM WITH CAD
TECHNIQUE: Bilateral screening digital craniocaudal and mediolateral oblique
mammograms were obtained. The images were evaluated with
computer-aided detection.

[R CC]
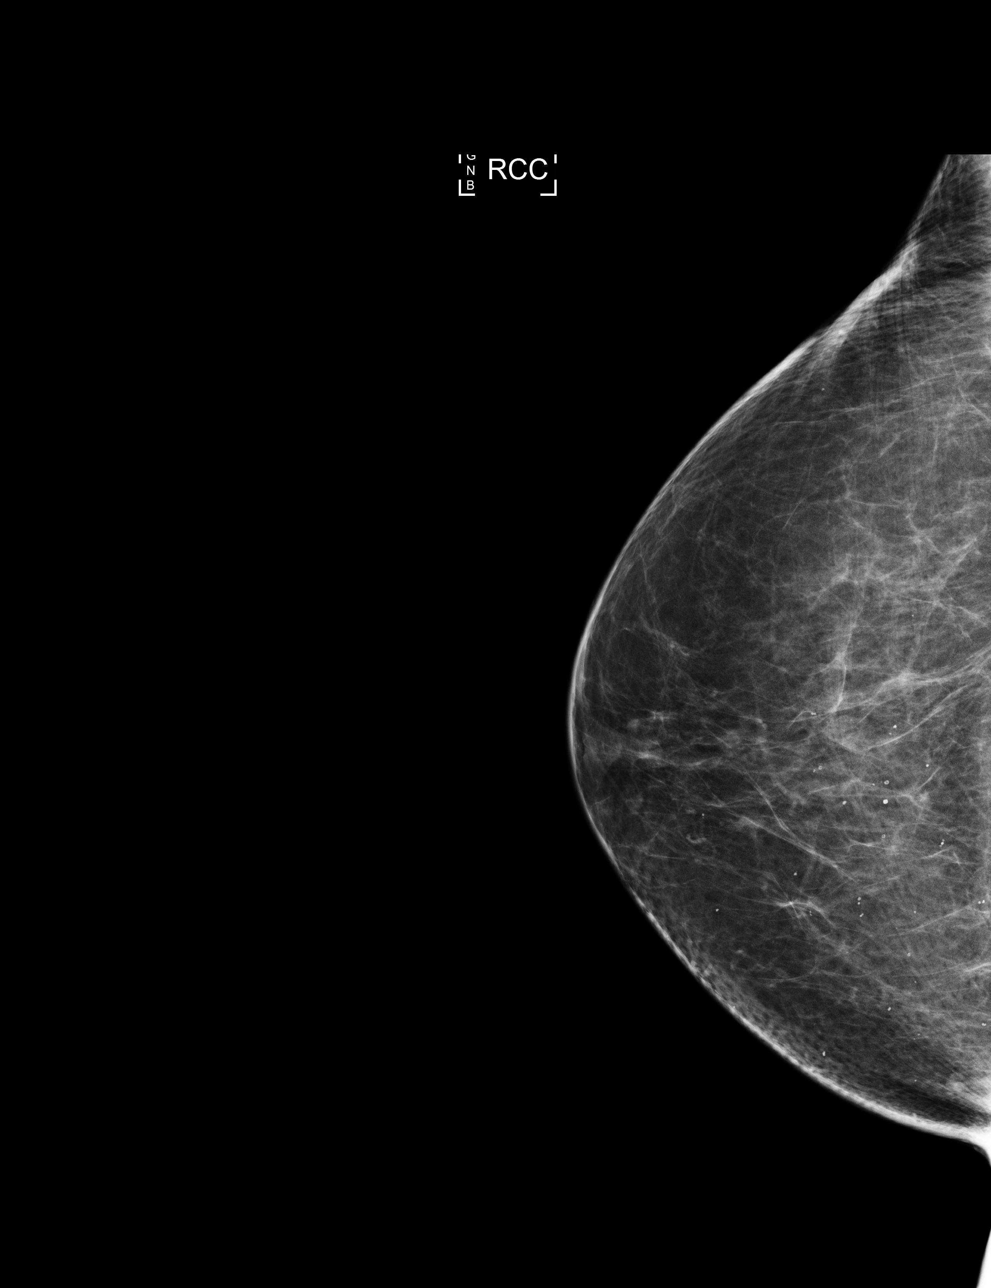

[L MLO]
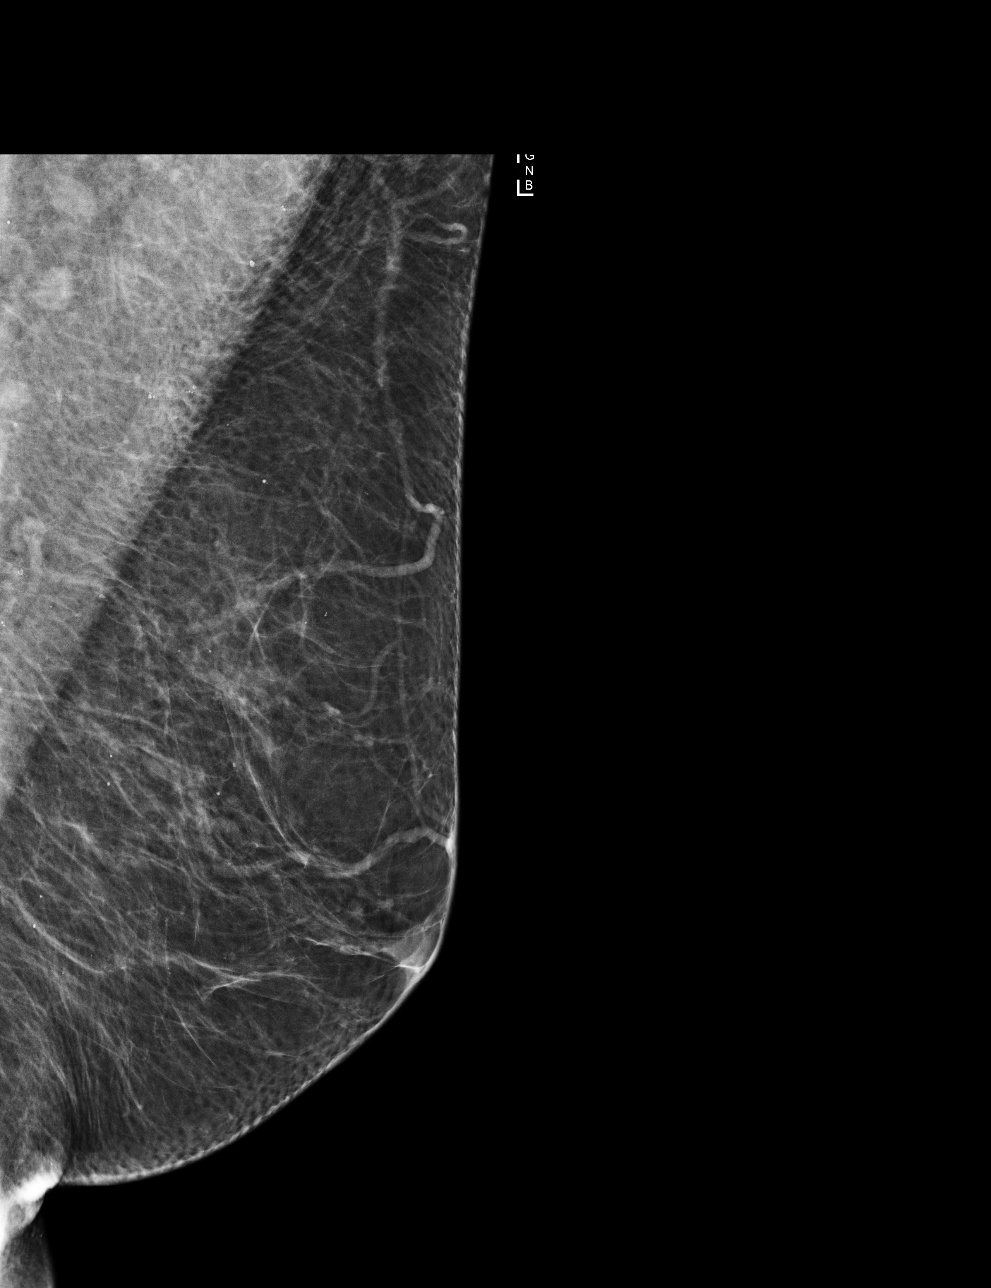

[R MLO]
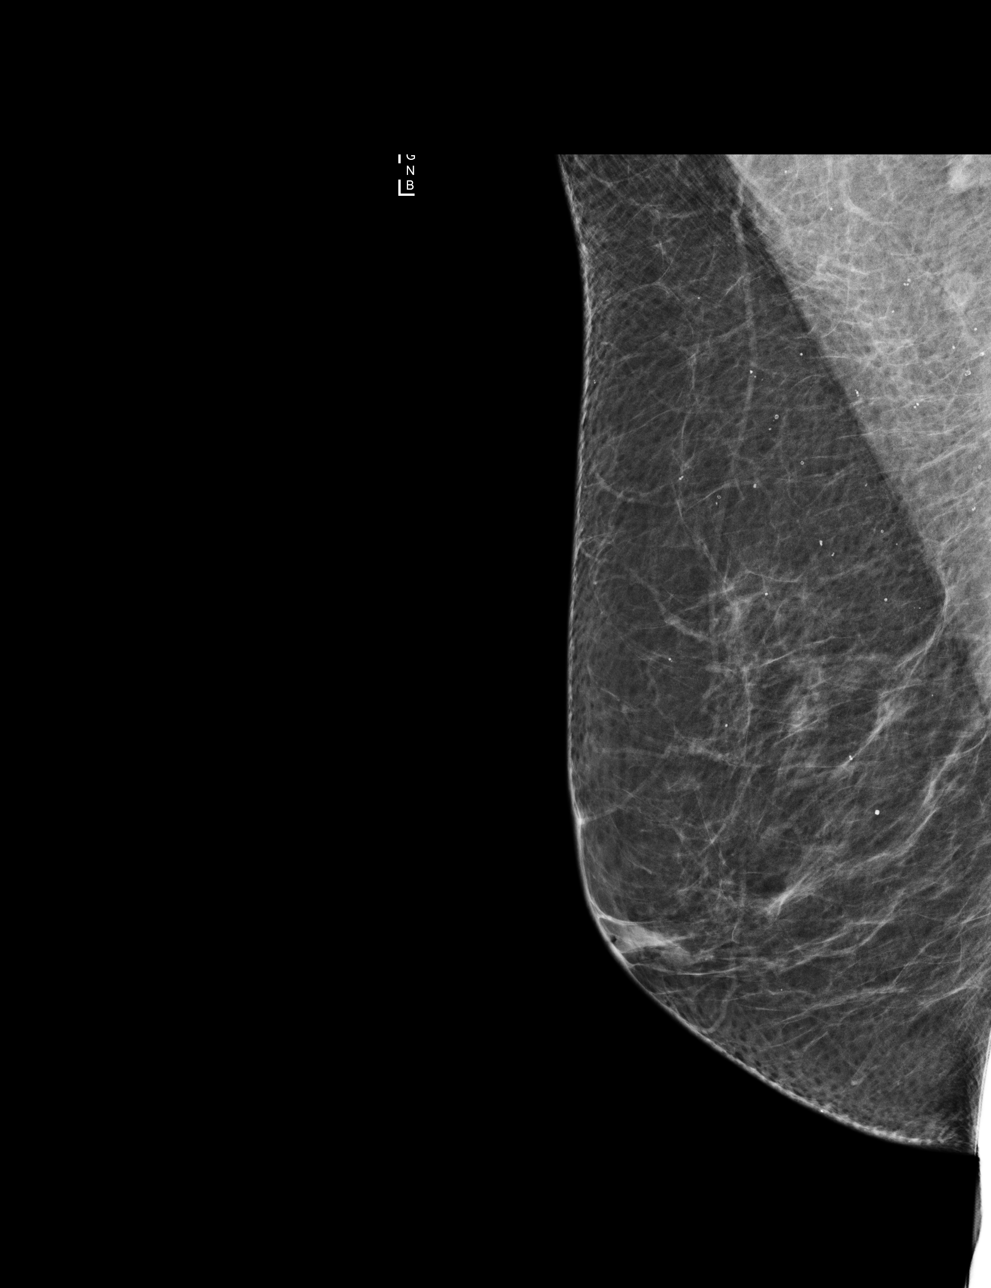

[L CC]
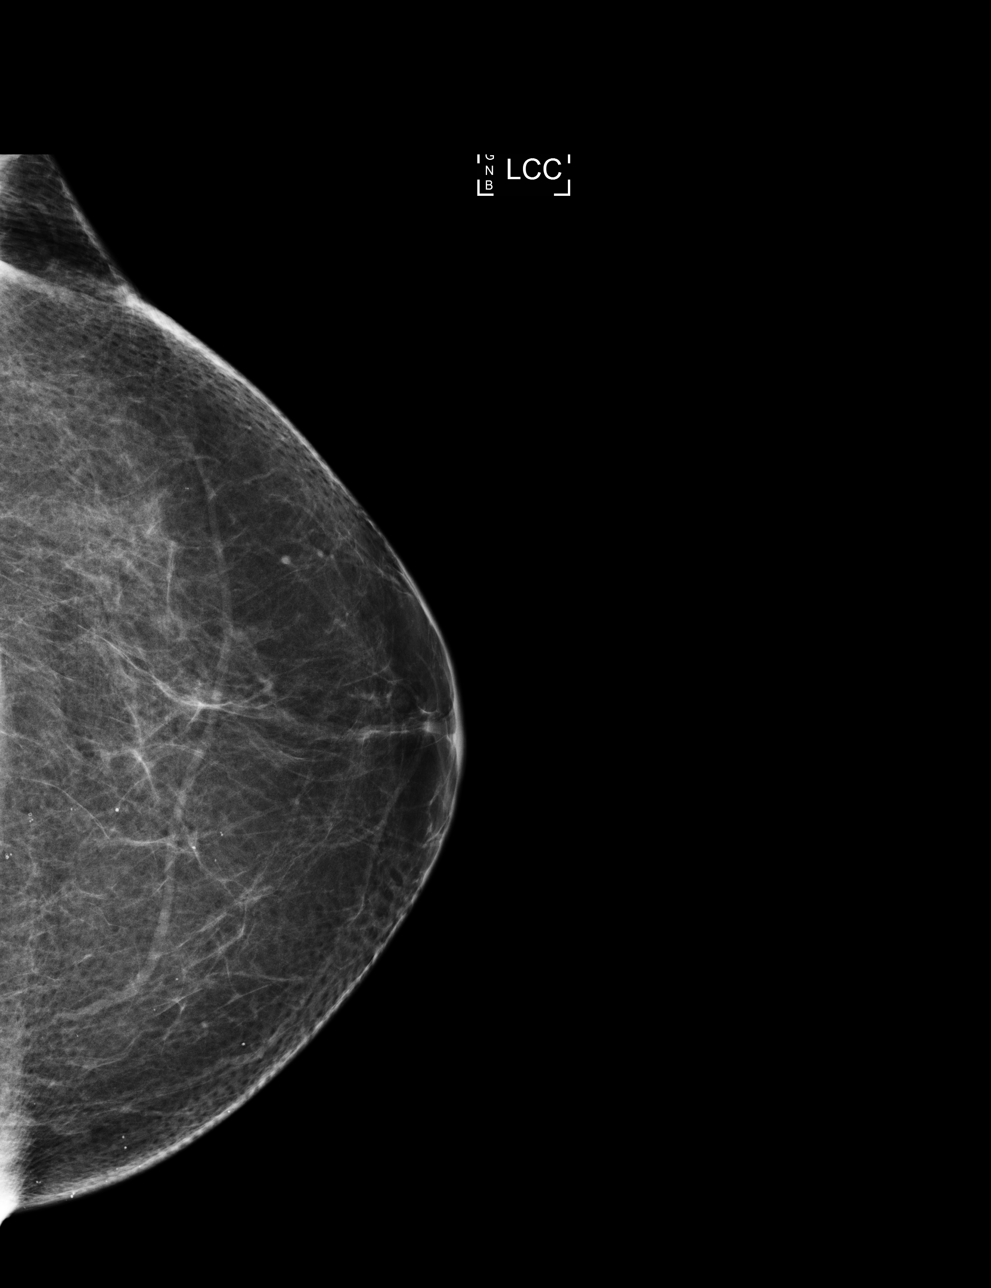

[4 of 4 positions shown; findings below may reference images not displayed]

ACR Breast Density Category b: There are scattered areas of
fibroglandular density.
FINDINGS: There are no findings suspicious for malignancy.
IMPRESSION: No mammographic evidence of malignancy. A result letter of this
screening mammogram will be mailed directly to the patient.

RECOMMENDATION:
Screening mammogram in one year. (Code:[id number]-V-ZRK)

BI-RADS CATEGORY  1: Negative.

## 2023-04-14 ENCOUNTER — Other Ambulatory Visit: Payer: Self-pay | Admitting: Obstetrics & Gynecology

## 2023-04-14 NOTE — Telephone Encounter (Signed)
Med refill request:Estrace Cream Last AEX: 01/11/2023 Next AEX: recall placed for 2025 Last MMG (if hormonal med): 03/22/2023-neg birads 1; Cat A Refill authorized: Rx pend.  Last fill per pharmacy on 08/16/2022.  Per EMR, rx sent on 01/11/2023 for #5 tubes. Rx refused and note routed to pharmacy.  Routing to provider for final review and closing encounter.

## 2023-07-17 ENCOUNTER — Ambulatory Visit: Payer: PPO | Admitting: Nurse Practitioner

## 2023-08-22 ENCOUNTER — Ambulatory Visit (INDEPENDENT_AMBULATORY_CARE_PROVIDER_SITE_OTHER): Payer: PPO | Admitting: Obstetrics and Gynecology

## 2023-08-22 ENCOUNTER — Encounter: Payer: Self-pay | Admitting: Obstetrics and Gynecology

## 2023-08-22 VITALS — BP 142/68 | HR 85 | Wt 121.0 lb

## 2023-08-22 DIAGNOSIS — N898 Other specified noninflammatory disorders of vagina: Secondary | ICD-10-CM

## 2023-08-22 DIAGNOSIS — N958 Other specified menopausal and perimenopausal disorders: Secondary | ICD-10-CM | POA: Insufficient documentation

## 2023-08-22 DIAGNOSIS — N8110 Cystocele, unspecified: Secondary | ICD-10-CM | POA: Diagnosis not present

## 2023-08-22 LAB — WET PREP FOR TRICH, YEAST, CLUE

## 2023-08-22 MED ORDER — TERCONAZOLE 0.8 % VA CREA: 1.0000 | TOPICAL_CREAM | Freq: Every day | VAGINAL | 0 refills | Status: AC

## 2023-08-22 NOTE — Assessment & Plan Note (Signed)
 Continue vaginal estrogen

## 2023-08-22 NOTE — Progress Notes (Signed)
 73 y.o. G66P2002 female with grade 4 cystocele (managed with Milex ring #6 with support), GSM, osteopenia here for vaginal irritation. Married. 2 yeast infections documented last year.  Pt states her pessary is starting to shift and when it comes down , it is causing some irritation. Thinks it may be time for a new one.  Removes and reinserts pessary almost every other day due to shifting. Pt states she has vaginal burning and has been using nystatin  x2 months but it has stopped helping x 2 months. States she has a vaginal odor.   No LMP recorded (lmp unknown). Patient is postmenopausal.    Last mammogram: 03/22/2023 BI-RADS 1, density A  GYN HISTORY: Indicated above  OB History  Gravida Para Term Preterm AB Living  2 2 2   2   SAB IAB Ectopic Multiple Live Births          # Outcome Date GA Lbr Len/2nd Weight Sex Type Anes PTL Lv  2 Term           1 Term             Past Medical History:  Diagnosis Date   Anxiety    Cataracts, bilateral    MD just watching   Heart murmur    Hx of migraine headaches     Past Surgical History:  Procedure Laterality Date   CATARACT EXTRACTION Bilateral    TUBAL LIGATION     WISDOM TOOTH EXTRACTION      Current Outpatient Medications on File Prior to Visit  Medication Sig Dispense Refill   Calcium Citrate-Vitamin D  (CALCIUM + D PO) Take by mouth.     estradiol  (ESTRACE ) 0.1 MG/GM vaginal cream Use 1 applicator cream weekly vaginally as needed. 42.5 g 4   MAGNESIUM PO Take by mouth.     Multiple Vitamin (MULTIVITAMIN PO) Take by mouth.     nystatin -triamcinolone  ointment (MYCOLOG) Apply 1 Application topically 2 (two) times daily. 30 g 3   TRIMO-SAN 0.025-0.01 % GEL SMARTSIG:0.5 Applicator Vaginal PRN     No current facility-administered medications on file prior to visit.    No Known Allergies    PE Today's Vitals   08/22/23 1001  BP: (!) 142/68  Pulse: 85  SpO2: 98%  Weight: 121 lb (54.9 kg)   Body mass index is 21.61  kg/m.  Physical Exam Vitals reviewed. Exam conducted with a chaperone present.  Constitutional:      General: She is not in acute distress.    Appearance: Normal appearance.  HENT:     Head: Normocephalic and atraumatic.     Nose: Nose normal.  Eyes:     Extraocular Movements: Extraocular movements intact.     Conjunctiva/sclera: Conjunctivae normal.  Pulmonary:     Effort: Pulmonary effort is normal.  Genitourinary:    General: Normal vulva.     Exam position: Lithotomy position.     Vagina: Prolapsed vaginal walls present. No vaginal discharge.     Cervix: Normal. No cervical motion tenderness, discharge or lesion.     Uterus: Normal. With uterine prolapse. Not enlarged and not tender.      Adnexa: Right adnexa normal and left adnexa normal.     Comments: Stage 4 anterior and apical prolapse Musculoskeletal:        General: Normal range of motion.     Cervical back: Normal range of motion.  Neurological:     General: No focal deficit present.     Mental  Status: She is alert.  Psychiatric:        Mood and Affect: Mood normal.        Behavior: Behavior normal.       Pessary Fitting: Ring pessary was removed, cleaned and resinserted. Pessary remained well positioned and was comfortable for the patient.  Assessment and Plan:        Baden-Walker grade 4 cystocele -     Pessary/other intravaginal support device insert/fit; Future Patient is not yet ready for surgical management of prolapse.  Genitourinary syndrome of menopause Assessment & Plan: Continue vaginal estrogen.   Vaginal discharge -     WET PREP FOR TRICH, YEAST, CLUE -     SureSwab Advanced Candida Vaginitis (CV), TMA -     Terconazole ; Place 1 applicator vaginally at bedtime for 3 days.  Dispense: 20 g; Refill: 0 Given symptoms, will treat empirically as prior wet prep was negative however yeast culture was positive.  Discussed risk versus benefits with patient and she is in agreement.  Vera LULLA Pa, MD

## 2023-08-23 LAB — SURESWAB® ADVANCED CANDIDA VAGINITIS (CV), TMA
CANDIDA SPECIES: DETECTED — AB
Candida glabrata: NOT DETECTED

## 2023-08-29 ENCOUNTER — Telehealth: Payer: Self-pay | Admitting: *Deleted

## 2023-08-29 DIAGNOSIS — B3731 Acute candidiasis of vulva and vagina: Secondary | ICD-10-CM

## 2023-08-29 MED ORDER — FLUCONAZOLE 150 MG PO TABS: 150.0000 mg | ORAL_TABLET | ORAL | 0 refills | Status: AC

## 2023-08-29 NOTE — Telephone Encounter (Signed)
 Call returned to patient. Tx for yeast 08/22/23 with Terconzole  3, completed on Saturday. Reports symptoms improved while on medication, symptoms of external burning and itching returned on Sunday. Denies any other symptoms. Uses estradiol  vaginal cream q3-5 days, has not used while using terconazole . Patient expressed concerns that she has been treated previously with terconazole , symptoms never seem to resolve.   Advised I will review with Dr. Dallie and f/u with recommendations, patient agreeable.

## 2023-08-29 NOTE — Telephone Encounter (Signed)
 Let's go back to oral therapy with diflucan for 3 doses. Rx sent.

## 2023-08-29 NOTE — Telephone Encounter (Signed)
 Patient notified

## 2023-09-25 ENCOUNTER — Telehealth: Payer: Self-pay

## 2023-09-25 DIAGNOSIS — N8110 Cystocele, unspecified: Secondary | ICD-10-CM

## 2023-09-25 NOTE — Telephone Encounter (Signed)
 Pt LVM in triage line stating that she would like a referral to UroGyn (Dr. Ollen Beverage @ [facility]).  Please advise if ok to send. TIA.

## 2023-09-26 NOTE — Telephone Encounter (Signed)
Per GH: "Okay to send."   Pt notified and voiced understanding. Referral to be followed by work queue.   Encounter closed.

## 2023-10-04 ENCOUNTER — Encounter: Payer: Self-pay | Admitting: Obstetrics and Gynecology

## 2023-10-04 ENCOUNTER — Ambulatory Visit (INDEPENDENT_AMBULATORY_CARE_PROVIDER_SITE_OTHER): Payer: PPO | Admitting: Obstetrics and Gynecology

## 2023-10-04 ENCOUNTER — Ambulatory Visit: Payer: PPO | Admitting: Obstetrics and Gynecology

## 2023-10-04 VITALS — BP 136/72 | HR 77 | Temp 97.7°F | Wt 122.0 lb

## 2023-10-04 DIAGNOSIS — N812 Incomplete uterovaginal prolapse: Secondary | ICD-10-CM

## 2023-10-04 DIAGNOSIS — N8110 Cystocele, unspecified: Secondary | ICD-10-CM | POA: Insufficient documentation

## 2023-10-04 DIAGNOSIS — N898 Other specified noninflammatory disorders of vagina: Secondary | ICD-10-CM

## 2023-10-04 DIAGNOSIS — N952 Postmenopausal atrophic vaginitis: Secondary | ICD-10-CM

## 2023-10-04 DIAGNOSIS — N958 Other specified menopausal and perimenopausal disorders: Secondary | ICD-10-CM

## 2023-10-04 LAB — WET PREP FOR TRICH, YEAST, CLUE

## 2023-10-04 NOTE — Progress Notes (Signed)
73 y.o. G43P2002 female with grade 4 cystocele (managed with Milex ring #6 with support), recurrent yeast, GSM, osteopenia here for pessary fitting. Married. 3 yeast infections documented last year (most recently only positive on sureswab.)  At 08/22/23 appt, she noted: Pt states her pessary is starting to shift and when it comes down , it is causing some irritation. Thinks it may be time for a new one.  Removes and reinserts pessary almost every other day due to shifting. Pt states she has vaginal burning and has been using nystatin x2 months but it has stopped helping x 2 months. States she has a vaginal odor.   Remains sexually active.  No LMP recorded (lmp unknown). Patient is postmenopausal.    Last mammogram: 03/22/2023 BI-RADS 1, density A  GYN HISTORY: Indicated above  OB History  Gravida Para Term Preterm AB Living  2 2 2   2   SAB IAB Ectopic Multiple Live Births          # Outcome Date GA Lbr Len/2nd Weight Sex Type Anes PTL Lv  2 Term           1 Term             Past Medical History:  Diagnosis Date   Anxiety    Cataracts, bilateral    MD just watching   Heart murmur    Hx of migraine headaches     Past Surgical History:  Procedure Laterality Date   CATARACT EXTRACTION Bilateral    TUBAL LIGATION     WISDOM TOOTH EXTRACTION      Current Outpatient Medications on File Prior to Visit  Medication Sig Dispense Refill   Calcium Citrate-Vitamin D (CALCIUM + D PO) Take by mouth.     estradiol (ESTRACE) 0.1 MG/GM vaginal cream Use 1 applicator cream weekly vaginally as needed. 42.5 g 4   MAGNESIUM PO Take by mouth.     Multiple Vitamin (MULTIVITAMIN PO) Take by mouth.     nystatin-triamcinolone ointment (MYCOLOG) Apply 1 Application topically 2 (two) times daily. 30 g 3   No current facility-administered medications on file prior to visit.    No Known Allergies    PE Today's Vitals   10/04/23 1035  BP: 136/72  Pulse: 77  Temp: 97.7 F (36.5 C)   TempSrc: Oral  SpO2: 98%  Weight: 122 lb (55.3 kg)   Body mass index is 21.78 kg/m.  Physical Exam Vitals reviewed. Exam conducted with a chaperone present.  Constitutional:      General: She is not in acute distress.    Appearance: Normal appearance.  HENT:     Head: Normocephalic and atraumatic.     Nose: Nose normal.  Eyes:     Extraocular Movements: Extraocular movements intact.     Conjunctiva/sclera: Conjunctivae normal.  Pulmonary:     Effort: Pulmonary effort is normal.  Genitourinary:    General: Normal vulva.     Exam position: Lithotomy position.     Vagina: Prolapsed vaginal walls present. No vaginal discharge.     Cervix: Normal. No cervical motion tenderness, discharge or lesion.     Uterus: Normal. With uterine prolapse. Not enlarged and not tender.      Adnexa: Right adnexa normal and left adnexa normal.     Comments: Stage 4 anterior and apical prolapse Musculoskeletal:        General: Normal range of motion.     Cervical back: Normal range of motion.  Neurological:  General: No focal deficit present.     Mental Status: She is alert.  Psychiatric:        Mood and Affect: Mood normal.        Behavior: Behavior normal.       Pessary Fitting: After performing speculum exam, fitting was started with #7 ring with support using surgical lubricant. Pessary remained well positioned with valsalva. Patient was instructed to ambulate and attempt to void successful). Pessary however was not well positioned with ambulation.  Fitting pessary was removed and replaced with new #3 donut, which was too small. Patient could not tolerate placement of #4 donut.  We then moved to #6 gellhorn, which remained well positioned. She was able to void with pessary in place Successful fitting. Tolerated well. Fitting pessary was removed and order was placed for #6 gellhorn pessary. Patient will be called to return once available.   Assessment and Plan:        Cystocele with  incomplete uterovaginal prolapse Assessment & Plan: Pessary Fitting: Order placed for #6 gellhorn. Patient is SA and aware that this pessary will need to be removed in the office. Has appt with Dr. Eber Hong 01/16/24 Will call once pessary has arrived for placement Continue PV estradiol Recommend coconut oil only for vaginal moisturizer Will r/o yeast with sureswab     Vaginal irritation -     WET PREP FOR TRICH, YEAST, CLUE -     SureSwab Advanced Vaginitis, TMA  Genitourinary syndrome of menopause   As above  Rosalyn Gess, MD

## 2023-10-04 NOTE — Assessment & Plan Note (Signed)
Pessary Fitting: Order placed for #6 gellhorn. Patient is SA and aware that this pessary will need to be removed in the office. Has appt with Dr. Eber Hong 01/16/24 Will call once pessary has arrived for placement Continue PV estradiol Recommend coconut oil only for vaginal moisturizer Will r/o yeast with sureswab

## 2023-10-05 ENCOUNTER — Telehealth: Payer: Self-pay | Admitting: *Deleted

## 2023-10-05 NOTE — Telephone Encounter (Signed)
Pessary ordered

## 2023-10-05 NOTE — Telephone Encounter (Signed)
-----   Message from Rosalyn Gess sent at 10/05/2023 12:02 PM EST ----- Regular stem ----- Message ----- From: Leda Min, RN Sent: 10/05/2023  11:24 AM EST To: Rosalyn Gess, MD  Dr. Kennith Center,   Can you confirm regular stem or short-stem?   [facility] ----- Message ----- From: Rosalyn Gess, MD Sent: 10/04/2023  12:03 PM EST To: Leda Min, RN  Please order [facility] 3in #6 gellhorn pessary.

## 2023-10-06 ENCOUNTER — Telehealth: Payer: Self-pay

## 2023-10-06 LAB — SURESWAB® ADVANCED VAGINITIS,TMA
CANDIDA SPECIES: NOT DETECTED
Candida glabrata: NOT DETECTED
SURESWAB(R) ADV BACTERIAL VAGINOSIS(BV),TMA: NEGATIVE
TRICHOMONAS VAGINALIS (TV),TMA: NOT DETECTED

## 2023-10-06 NOTE — Telephone Encounter (Signed)
 Pt notified and voiced understanding. Encounter closed.

## 2023-10-06 NOTE — Telephone Encounter (Signed)
Per GH:  "Will plan on having her come back in for an additional OV for new pessary insertion and will show/tell the pt how to apply the cream also at that time."

## 2023-10-06 NOTE — Telephone Encounter (Signed)
Pt LVM in triage line inquiring how she is supposed to make sure that vaginal cream gets all into vagina given new type of pessary being ordered?   Please advise.

## 2023-10-09 ENCOUNTER — Encounter: Payer: Self-pay | Admitting: Obstetrics and Gynecology

## 2023-10-18 ENCOUNTER — Telehealth: Payer: Self-pay

## 2023-10-18 NOTE — Telephone Encounter (Signed)
 Patient left a message on triage voicemail stating that Dr. Kennith Center was ordering a pessary for her. She was wanting to know if it had come in yet so she could schedule her appointment. Routing to [place], Charity fundraiser, Armed forces technical officer.

## 2023-10-18 NOTE — Telephone Encounter (Signed)
 See open encounter dated 10/05/23.  Encounter closed.

## 2023-11-30 NOTE — Telephone Encounter (Signed)
 Status update requested from Manufacture. Item on backorder to date.

## 2023-12-05 NOTE — Telephone Encounter (Signed)
 Patient called asking for update regarding pessary. She said it has been like 8 weeks she has been waiting for it. I told the patient per note on 11-30-23 that it was on back order but that I would send a message to see what next step should be. Routing to [place], Armed forces technical officer

## 2023-12-06 NOTE — Telephone Encounter (Signed)
 Order status update received: manufacture backorder, no estimate of delivery.   Dr. Andrena Ke -please review, would you like to try an alternative option?

## 2023-12-08 NOTE — Telephone Encounter (Signed)
 Spoke with patient, advised per Dr. Andrena Ke.   Review alternative pessary, alternative ordered.   Will review with Dr. Andrena Ke when pessary arrives and notify patient. Patient agreeable.

## 2023-12-14 NOTE — Telephone Encounter (Signed)
 Alternative Gellhorn #6 pessary received.   Spoke with patient, will proceed with fitting for alternative. Will notify patient when [facility] Gellhorn is off backorder and received.   OV scheduled for 5/7 at 1000 with Dr. Andrena Ke.   Pessary to Cathleen Coach, CMA  Routing to provider for final review. Patient is agreeable to disposition. Will close encounter.

## 2023-12-20 ENCOUNTER — Encounter: Payer: Self-pay | Admitting: Obstetrics and Gynecology

## 2023-12-20 ENCOUNTER — Ambulatory Visit: Admitting: Obstetrics and Gynecology

## 2023-12-20 VITALS — BP 118/64 | HR 69 | Temp 97.7°F | Wt 120.0 lb

## 2023-12-20 DIAGNOSIS — N812 Incomplete uterovaginal prolapse: Secondary | ICD-10-CM

## 2023-12-20 DIAGNOSIS — F419 Anxiety disorder, unspecified: Secondary | ICD-10-CM | POA: Diagnosis not present

## 2023-12-20 MED ORDER — HYDROXYZINE HCL 10 MG PO TABS: 10.0000 mg | ORAL_TABLET | Freq: Three times a day (TID) | ORAL | 1 refills | Status: AC | PRN

## 2023-12-20 NOTE — Progress Notes (Signed)
 73 y.o. G39P2002 female with grade 4 cystocele (managed with Milex ring #6 with support), recurrent yeast, GSM, osteopenia here for pessary insertion. Married. 3 yeast infections documented last year (most recently only positive on sureswab.) Husband has brain cancer, may need additional surgery.  At 08/22/23 appt, she noted: Pt states her pessary is starting to shift and when it comes down , it is causing some irritation. Thinks it may be time for a new one.  Removes and reinserts pessary almost every other day due to shifting. Pt states she has vaginal burning and has been using nystatin  x2 months but it has stopped helping x 2 months. States she has a vaginal odor.   Remains sexually active.  Today, she reports concerns with anxiety and possible medication. Anxiety due to husband's brain cancer. Would like to start medication. No time for counseling.  No LMP recorded (lmp unknown). Patient is postmenopausal.   Last mammogram: 03/22/2023 BI-RADS 1, density A  GYN HISTORY: Indicated above  OB History  Gravida Para Term Preterm AB Living  2 2 2   2   SAB IAB Ectopic Multiple Live Births          # Outcome Date GA Lbr Len/2nd Weight Sex Type Anes PTL Lv  2 Term           1 Term             Past Medical History:  Diagnosis Date   Anxiety    Cataracts, bilateral    MD just watching   Heart murmur    Hx of migraine headaches     Past Surgical History:  Procedure Laterality Date   CATARACT EXTRACTION Bilateral    TUBAL LIGATION     WISDOM TOOTH EXTRACTION      Current Outpatient Medications on File Prior to Visit  Medication Sig Dispense Refill   Calcium Citrate-Vitamin D  (CALCIUM + D PO) Take by mouth.     CYCLOBENZAPRINE HCL PO Take by mouth as needed (migraine).     estradiol  (ESTRACE ) 0.1 MG/GM vaginal cream Use 1 applicator cream weekly vaginally as needed. 42.5 g 4   Ibuprofen (ADVIL PO) Take by mouth.     MAGNESIUM PO Take by mouth.     Multiple Vitamin  (MULTIVITAMIN PO) Take by mouth.     nystatin -triamcinolone  ointment (MYCOLOG) Apply 1 Application topically 2 (two) times daily. 30 g 3   No current facility-administered medications on file prior to visit.    No Known Allergies    PE Today's Vitals   12/20/23 1007  BP: 118/64  Pulse: 69  Temp: 97.7 F (36.5 C)  TempSrc: Oral  SpO2: 99%  Weight: 120 lb (54.4 kg)   Body mass index is 21.43 kg/m.  Physical Exam Vitals reviewed. Exam conducted with a chaperone present.  Constitutional:      General: She is not in acute distress.    Appearance: Normal appearance.  HENT:     Head: Normocephalic and atraumatic.     Nose: Nose normal.  Eyes:     Extraocular Movements: Extraocular movements intact.     Conjunctiva/sclera: Conjunctivae normal.  Pulmonary:     Effort: Pulmonary effort is normal.  Genitourinary:    General: Normal vulva.     Exam position: Lithotomy position.     Vagina: Prolapsed vaginal walls present. No vaginal discharge.     Uterus: With uterine prolapse.      Comments: Stage 4 anterior and apical prolapse Musculoskeletal:  General: Normal range of motion.     Cervical back: Normal range of motion.  Neurological:     General: No focal deficit present.     Mental Status: She is alert.  Psychiatric:        Mood and Affect: Mood normal.        Behavior: Behavior normal.       Pessary Cleaning: Previously fitted for #6 gellhorn.  Now in stock. Ring pessary removed.  #6 Gellhorn inserted without complications.  No pain after insertion.  Assessment and Plan:        Cystocele with incomplete uterovaginal prolapse -     PR PESSARY, NON RUBBER,ANY TYPE  Anxiety -     hydrOXYzine HCl; Take 1 tablet (10 mg total) by mouth 3 (three) times daily as needed.  Dispense: 30 tablet; Refill: 1   Uncomplicated pessary insertion. Will start Atarax Return to office for annual exam in 1 month Reassess symptoms at that time. Romaine Closs, MD

## 2024-01-12 ENCOUNTER — Ambulatory Visit (INDEPENDENT_AMBULATORY_CARE_PROVIDER_SITE_OTHER): Admitting: Obstetrics and Gynecology

## 2024-01-12 ENCOUNTER — Ambulatory Visit: Payer: Self-pay | Admitting: Obstetrics and Gynecology

## 2024-01-12 ENCOUNTER — Encounter: Payer: Self-pay | Admitting: Obstetrics and Gynecology

## 2024-01-12 ENCOUNTER — Other Ambulatory Visit (HOSPITAL_COMMUNITY)
Admission: RE | Admit: 2024-01-12 | Discharge: 2024-01-12 | Disposition: A | Discharge status: Home / Self Care | Source: Ambulatory Visit | Attending: Obstetrics and Gynecology | Admitting: Obstetrics and Gynecology

## 2024-01-12 VITALS — BP 120/74 | HR 81 | Ht 63.5 in | Wt 117.0 lb

## 2024-01-12 DIAGNOSIS — B3731 Acute candidiasis of vulva and vagina: Secondary | ICD-10-CM

## 2024-01-12 DIAGNOSIS — Z9189 Other specified personal risk factors, not elsewhere classified: Secondary | ICD-10-CM | POA: Diagnosis not present

## 2024-01-12 DIAGNOSIS — M85852 Other specified disorders of bone density and structure, left thigh: Secondary | ICD-10-CM | POA: Diagnosis not present

## 2024-01-12 DIAGNOSIS — Z01419 Encounter for gynecological examination (general) (routine) without abnormal findings: Secondary | ICD-10-CM | POA: Insufficient documentation

## 2024-01-12 DIAGNOSIS — N958 Other specified menopausal and perimenopausal disorders: Secondary | ICD-10-CM

## 2024-01-12 DIAGNOSIS — N898 Other specified noninflammatory disorders of vagina: Secondary | ICD-10-CM

## 2024-01-12 DIAGNOSIS — Z124 Encounter for screening for malignant neoplasm of cervix: Secondary | ICD-10-CM | POA: Diagnosis present

## 2024-01-12 DIAGNOSIS — R87610 Atypical squamous cells of undetermined significance on cytologic smear of cervix (ASC-US): Secondary | ICD-10-CM

## 2024-01-12 DIAGNOSIS — N812 Incomplete uterovaginal prolapse: Secondary | ICD-10-CM | POA: Diagnosis not present

## 2024-01-12 DIAGNOSIS — Z8742 Personal history of other diseases of the female genital tract: Secondary | ICD-10-CM

## 2024-01-12 DIAGNOSIS — Z1331 Encounter for screening for depression: Secondary | ICD-10-CM

## 2024-01-12 LAB — WET PREP FOR TRICH, YEAST, CLUE

## 2024-01-12 MED ORDER — ESTRADIOL 0.1 MG/GM VA CREA: TOPICAL_CREAM | VAGINAL | 1 refills | Status: DC

## 2024-01-12 MED ORDER — FLUCONAZOLE 150 MG PO TABS: 150.0000 mg | ORAL_TABLET | ORAL | 0 refills | Status: AC

## 2024-01-12 NOTE — Patient Instructions (Signed)

## 2024-01-12 NOTE — Progress Notes (Signed)
 73 y.o. [id number] postmenopausal female with grade 4 cystocele (managed with #6 gellhorn), recurrent yeast, GSM, osteopenia here for annual exam. Married. Husband has brain cancer, may need additional surgery.    Vaginal odor and discharge with pessary. Urinary leakage with bending over with full bladder. Feels the pessary constricts her bowels. Had this symptoms with prior pessary, however she was able to remove prior pessary for bowel movements. Has appt with UROGYN next week.  Her husband is starting a new treatment for brain cancer today.  She has overall has been doing well with managing his diagnosis.  She uses meditation, deep breathing, walking to help with her anxiety.  She has not taken Atarax  prescribed at her last appointment.  3 yeast infections documented last year (most recently only positive on sureswab.)   Postmenopausal bleeding: none Pelvic discharge or pain: none Breast mass, nipple discharge or skin changes : none  Last PAP:     Component Value Date/Time   DIAGPAP (A) 01/11/2023 1432    - Atypical squamous cells of undetermined significance (ASC-US )   HPVHIGH Negative 01/11/2023 1432   ADEQPAP  01/11/2023 1432    Satisfactory for evaluation; transformation zone component PRESENT.   Last mammogram: 03/22/23 BIRADS 1, density a Last DXA: 02/24/22, T-score -1.8 Last colonoscopy: 10/14/19, q31yr Sexually active: yes  Exercising: no Smoker:no  Flowsheet Row Office Visit from 01/12/2024 in [facility]  PHQ-2 Total Score 0         GYN HISTORY: No sig hx  OB History  Gravida Para Term Preterm AB Living  2 2 2   2   SAB IAB Ectopic Multiple Live Births          # Outcome Date GA Lbr Len/2nd Weight Sex Type Anes PTL Lv  2 Term           1 Term            Past Medical History:  Diagnosis Date   Anxiety    Cataracts, bilateral    MD just watching   Heart murmur    Hx of migraine headaches    Past Surgical History:  Procedure  Laterality Date   CATARACT EXTRACTION Bilateral    TUBAL LIGATION     WISDOM TOOTH EXTRACTION     Current Outpatient Medications on File Prior to Visit  Medication Sig Dispense Refill   Calcium Citrate-Vitamin D  (CALCIUM + D PO) Take by mouth.     CYCLOBENZAPRINE HCL PO Take by mouth as needed (migraine).     Ibuprofen (ADVIL PO) Take by mouth.     MAGNESIUM PO Take by mouth.     Multiple Vitamin (MULTIVITAMIN PO) Take by mouth.     nystatin -triamcinolone  ointment (MYCOLOG) Apply 1 Application topically 2 (two) times daily. 30 g 3   hydrOXYzine  (ATARAX ) 10 MG tablet Take 1 tablet (10 mg total) by mouth 3 (three) times daily as needed. (Patient not taking: Reported on 01/12/2024) 30 tablet 1   No current facility-administered medications on file prior to visit.   Social History   Socioeconomic History   Marital status: Married    Spouse name: Not on file   Number of children: Not on file   Years of education: Not on file   Highest education level: Not on file  Occupational History   Not on file  Tobacco Use   Smoking status: Never   Smokeless tobacco: Never  Vaping Use   Vaping status: Never Used  Substance  and Sexual Activity   Alcohol use: Yes    Comment: occ   Drug use: Never   Sexual activity: Yes    Partners: Male    Birth control/protection: Post-menopausal    Comment: 1st intercourse- 44, partners- 1  Other Topics Concern   Not on file  Social History Narrative   Not on file   Social Drivers of Health   Financial Resource Strain: Not on file  Food Insecurity: Not on file  Transportation Needs: Not on file  Physical Activity: Not on file  Stress: Not on file  Social Connections: Not on file  Intimate Partner Violence: Not on file   Family History  Problem Relation Age of Onset   Hypertension Father    Breast cancer Paternal Aunt    Breast cancer Paternal Aunt    Diabetes Maternal Grandmother    No Known Allergies    PE Today's Vitals   01/12/24  0946  BP: 120/74  Pulse: 81  TempSrc: Oral  SpO2: 98%  Weight: 117 lb (53.1 kg)  Height: 5' 3.5" (1.613 m)   Body mass index is 20.4 kg/m.  Physical Exam Vitals reviewed. Exam conducted with a chaperone present.  Constitutional:      General: She is not in acute distress.    Appearance: Normal appearance.  HENT:     Head: Normocephalic and atraumatic.     Nose: Nose normal.  Eyes:     Extraocular Movements: Extraocular movements intact.     Conjunctiva/sclera: Conjunctivae normal.  Neck:     Thyroid: No thyroid mass, thyromegaly or thyroid tenderness.  Pulmonary:     Effort: Pulmonary effort is normal.  Chest:     Chest wall: No mass or tenderness.  Breasts:    Right: Normal. No swelling, mass, nipple discharge, skin change or tenderness.     Left: Normal. No swelling, mass, nipple discharge, skin change or tenderness.  Abdominal:     General: There is no distension.     Palpations: Abdomen is soft.     Tenderness: There is no abdominal tenderness.  Genitourinary:    General: Normal vulva.     Exam position: Lithotomy position.     Urethra: No prolapse.     Vagina: Vaginal discharge and prolapsed vaginal walls present. No bleeding.     Cervix: Normal. No lesion.     Uterus: Normal. Not enlarged and not tender.      Adnexa: Right adnexa normal and left adnexa normal.  Musculoskeletal:        General: Normal range of motion.     Cervical back: Normal range of motion.  Lymphadenopathy:     Upper Body:     Right upper body: No axillary adenopathy.     Left upper body: No axillary adenopathy.     Lower Body: No right inguinal adenopathy. No left inguinal adenopathy.  Skin:    General: Skin is warm and dry.  Neurological:     General: No focal deficit present.     Mental Status: She is alert.  Psychiatric:        Mood and Affect: Mood normal.        Behavior: Behavior normal.      Pessary Cleaning: Pessary was easily removed. Pelvic exam revealed no discharge,  no blood, or ulcerations. Vaginal mucosa intact.  Pessary cleaned with antimicrobial soap and rinsed.  Pessary inserted in the vagina easily.   Assessment and Plan:        Well woman  exam with routine gynecological exam Assessment & Plan: Cervical cancer screening performed according to ASCCP guidelines. Encouraged annual mammogram screening Colonoscopy UTD DXA due Labs and immunizations with her primary Encouraged safe sexual practices as indicated Encouraged healthy lifestyle practices with diet and exercise For patients over 70yo, I recommend 1200mg  calcium daily and 800IU of vitamin D  daily.    Cervical cancer screening -     Cytology - PAP  History of abnormal cervical Pap smear  Cystocele with incomplete uterovaginal prolapse Assessment & Plan:  #6 gellhorn cleaned and replaced. Consider switching back to #6 ring given bladder and rectum symptoms. Continue PV estradiol  Has appt with Dr. Isreal Marinas UROGYN 01/16/24   Osteopenia of neck of left femur -     DG Bone Density; Future  Genitourinary syndrome of menopause -     Estradiol ; Apply 1/2 gram to vulva nightly for 2 weeks then decrease to 1/2 gram to vulva two nights a week. Do not use applicator.  Dispense: 42.5 g; Refill: 1  Vaginal discharge -     WET PREP FOR TRICH, YEAST, CLUE  Yeast vaginitis -     Fluconazole ; Take 1 tablet (150 mg total) by mouth every 3 (three) days for 3 doses.  Dispense: 3 tablet; Refill: 0  Consider ppx, given 4th infection over past year Romaine Closs, MD

## 2024-01-12 NOTE — Assessment & Plan Note (Signed)
 Cervical cancer screening performed according to ASCCP guidelines. Encouraged annual mammogram screening Colonoscopy UTD DXA due  Labs and immunizations with her primary Encouraged safe sexual practices as indicated Encouraged healthy lifestyle practices with diet and exercise For patients over 70yo, I recommend 1200mg  calcium daily and 800IU of vitamin D daily.

## 2024-01-12 NOTE — Assessment & Plan Note (Addendum)
#  6 gellhorn cleaned and replaced. Consider switching back to #6 ring given bladder and rectum symptoms. Continue PV estradiol  Has appt with Dr. Schroder UROGYN 01/16/24

## 2024-01-15 ENCOUNTER — Ambulatory Visit: Payer: PPO | Admitting: Obstetrics and Gynecology

## 2024-01-16 ENCOUNTER — Encounter: Payer: Self-pay | Admitting: Obstetrics and Gynecology

## 2024-01-16 ENCOUNTER — Ambulatory Visit: Payer: PPO | Admitting: Obstetrics and Gynecology

## 2024-01-16 ENCOUNTER — Other Ambulatory Visit (HOSPITAL_COMMUNITY)
Admission: RE | Admit: 2024-01-16 | Discharge: 2024-01-16 | Disposition: A | Discharge status: Home / Self Care | Source: Other Acute Inpatient Hospital | Attending: Obstetrics and Gynecology | Admitting: Obstetrics and Gynecology

## 2024-01-16 ENCOUNTER — Other Ambulatory Visit: Payer: Self-pay | Admitting: Obstetrics and Gynecology

## 2024-01-16 VITALS — BP 110/60 | HR 77 | Ht 64.0 in | Wt 114.4 lb

## 2024-01-16 DIAGNOSIS — N812 Incomplete uterovaginal prolapse: Secondary | ICD-10-CM | POA: Diagnosis not present

## 2024-01-16 DIAGNOSIS — R319 Hematuria, unspecified: Secondary | ICD-10-CM

## 2024-01-16 DIAGNOSIS — R350 Frequency of micturition: Secondary | ICD-10-CM

## 2024-01-16 DIAGNOSIS — R82998 Other abnormal findings in urine: Secondary | ICD-10-CM

## 2024-01-16 DIAGNOSIS — Z13220 Encounter for screening for lipoid disorders: Secondary | ICD-10-CM

## 2024-01-16 DIAGNOSIS — N393 Stress incontinence (female) (male): Secondary | ICD-10-CM | POA: Diagnosis not present

## 2024-01-16 LAB — URINALYSIS, ROUTINE W REFLEX MICROSCOPIC
Bilirubin Urine: NEGATIVE
Glucose, UA: NEGATIVE mg/dL
Hgb urine dipstick: NEGATIVE
Ketones, ur: NEGATIVE mg/dL
Nitrite: NEGATIVE
Protein, ur: NEGATIVE mg/dL
Specific Gravity, Urine: 1.015 (ref 1.005–1.030)
pH: 6 (ref 5.0–8.0)

## 2024-01-16 LAB — POCT URINALYSIS DIP (CLINITEK)
Bilirubin, UA: NEGATIVE
Glucose, UA: NEGATIVE mg/dL
Ketones, POC UA: NEGATIVE mg/dL
Nitrite, UA: NEGATIVE
POC PROTEIN,UA: NEGATIVE
Spec Grav, UA: 1.015 (ref 1.010–1.025)
Urobilinogen, UA: 0.2 U/dL
pH, UA: 7 (ref 5.0–8.0)

## 2024-01-16 NOTE — Progress Notes (Signed)
 New Patient Evaluation and Consultation  Referring Provider: Romaine Closs, MD PCP: Senaida Dama, MD (Inactive) Date of Service: 01/16/2024  SUBJECTIVE Chief Complaint: New Patient (Initial Visit) Jacqueline Garrett is a 73 y.o. female here today for female organ prolapse.)  History of Present Illness: Jacqueline Garrett is a 73 y.o. White or Caucasian female seen in consultation at the request of Dr Andrena Ke for evaluation of prolapse.    Review of records significant for: Has ring #6 with support for prolapse- replaced with a #6 gellhorn  Urinary Symptoms: Leaks urine with leaning over when bladder is full Leakage does not happen often (maybe 3-4 times but now more often with the gellhorn pessary) Pad use: none Patient is bothered by UI symptoms.  Day time voids 8-10.  Nocturia: 2-5 times per night to void. She does not feel that she has urgency Voiding dysfunction:  empties bladder well.  Patient does not use a catheter to empty bladder.  When urinating, patient feels she has no difficulties Drinks: 50+ oz water, some milk per day  UTIs: 0 UTI's in the last year.   Denies history of blood in urine and kidney or bladder stones   Pelvic Organ Prolapse Symptoms:                  Patient Admits to a feeling of a bulge the vaginal area. It has been present for several years.  Patient Admits to seeing a bulge.  This bulge is bothersome. She was previously using the pessary she could remove (the ring)- but it would shift to the opening. Now with the gellhorn she cannot remove it.   Bowel Symptom: Bowel movements: 1-2 time(s) per day Stool consistency: soft  Straining: yes.  Splinting: yes.  Incomplete evacuation: yes.  Patient Denies accidental bowel leakage / fecal incontinence Bowel regimen: diet, water  HM Colonoscopy          Upcoming     Colonoscopy (Every 7 Years) Next due on 10/14/2026    10/14/2019  COLONOSCOPY   Only the first 1 history entries have  been loaded, but more history exists.               Sexual Function Sexually active: yes.  Sexual orientation: heterosexual Pain with sex: No  Pelvic Pain Denies pelvic pain   Past Medical History:  Past Medical History:  Diagnosis Date   Anxiety    Cataracts, bilateral    MD just watching   Heart murmur    Hx of migraine headaches     Past Surgical History:   Past Surgical History:  Procedure Laterality Date   barholin gland excision     CATARACT EXTRACTION Bilateral    TUBAL LIGATION     WISDOM TOOTH EXTRACTION       Past OB/GYN History: OB History  Gravida Para Term Preterm AB Living  2 2 2   2   SAB IAB Ectopic Multiple Live Births      2    # Outcome Date GA Lbr Len/2nd Weight Sex Type Anes PTL Lv  2 Term     M Vag-Spont   LIV  1 Term     F Vag-Forceps   LIV   Had an episiotomy Menopausal: Denies vaginal bleeding since menopause     Component Value Date/Time   DIAGPAP (A) 01/11/2023 1432    - Atypical squamous cells of undetermined significance (ASC-US )   HPVHIGH Negative 01/11/2023 1432   ADEQPAP  01/11/2023 1432  Satisfactory for evaluation; transformation zone component PRESENT.    Medications: Patient has a current medication list which includes the following prescription(s): calcium-vitamin d -vitamin k, cyclobenzaprine hcl, estradiol , fluconazole , hydroxyzine , ibuprofen, magnesium, multiple vitamin, and nystatin -triamcinolone  ointment.   Allergies: Patient has no known allergies.   Social History:  Social History   Tobacco Use   Smoking status: Never   Smokeless tobacco: Never  Vaping Use   Vaping status: Never Used  Substance Use Topics   Alcohol use: Yes    Comment: occ   Drug use: Never    Relationship status: married Patient lives with her spouse.   Patient is not employed. Regular exercise: Yes: walking, some weight lifting History of abuse: No  Family History:   Family History  Problem Relation Age of Onset    Hypertension Father    Diabetes Maternal Grandmother    Breast cancer Paternal Aunt    Breast cancer Paternal Aunt    Bladder Cancer Neg Hx    Renal cancer Neg Hx    Uterine cancer Neg Hx      Review of Systems: Review of Systems  Constitutional:  Negative for fever, malaise/fatigue and weight loss.  Respiratory:  Negative for cough, shortness of breath and wheezing.   Cardiovascular:  Negative for chest pain, palpitations and leg swelling.  Gastrointestinal:  Negative for abdominal pain and blood in stool.       + vaginal discharge  Genitourinary:  Negative for dysuria.  Musculoskeletal:  Negative for myalgias.  Skin:  Negative for rash.  Neurological:  Negative for dizziness and headaches.  Endo/Heme/Allergies:  Does not bruise/bleed easily.  Psychiatric/Behavioral:  Negative for depression. The patient is not nervous/anxious.      OBJECTIVE Physical Exam: Vitals:   01/16/24 1022  BP: 110/60  Pulse: 77  Weight: 114 lb 6.4 oz (51.9 kg)  Height: 5\' 4"  (1.626 m)    Physical Exam Vitals reviewed. Exam conducted with a chaperone present.  Constitutional:      General: She is not in acute distress. Pulmonary:     Effort: Pulmonary effort is normal.  Abdominal:     General: There is no distension.     Palpations: Abdomen is soft.     Tenderness: There is no abdominal tenderness. There is no rebound.  Musculoskeletal:        General: No swelling. Normal range of motion.  Skin:    General: Skin is warm and dry.     Findings: No rash.  Neurological:     Mental Status: She is alert and oriented to person, place, and time.  Psychiatric:        Mood and Affect: Mood normal.        Behavior: Behavior normal.      GU / Detailed Urogynecologic Evaluation:  Pelvic Exam: Normal external female genitalia; Bartholin's and Skene's glands normal in appearance; urethral meatus normal in appearance, no urethral masses or discharge.   CST: negative  Gellhorn pessary removed  and cleaned.  Speculum exam reveals normal vaginal mucosa with atrophy. Cervix normal appearance. Uterus normal single, nontender. Adnexa no mass, fullness, tenderness.  #6 ss gellhorn pessary replaced and pt give long stem gellhorn to take home.    Pelvic floor strength II/V  Pelvic floor musculature: Right levator non-tender, Right obturator non-tender, Left levator non-tender, Left obturator non-tender  POP-Q:   POP-Q  2  Aa   2                                           Ba  -4                                              C   5                                            Gh  4                                            Pb  8                                            tvl   -1                                            Ap  -1                                            Bp  -4                                              D      Rectal Exam:  External hemorrhoids present  Post-Void Residual (PVR) by Bladder Scan: In order to evaluate bladder emptying, we discussed obtaining a postvoid residual and patient agreed to this procedure.  Procedure: The ultrasound unit was placed on the patient's abdomen in the suprapubic region after the patient had voided.    Post Void Residual - 01/16/24 1035       Post Void Residual   Post Void Residual 4 mL              Laboratory Results: Lab Results  Component Value Date   COLORU yellow 01/16/2024   CLARITYU clear 01/16/2024   GLUCOSEUR negative 01/16/2024   BILIRUBINUR negative 01/16/2024   SPECGRAV 1.015 01/16/2024   RBCUR trace-intact (A) 01/16/2024   PHUR 7.0 01/16/2024   PROTEINUR NEGATIVE 01/26/2023   UROBILINOGEN 0.2 01/16/2024   LEUKOCYTESUR Small (1+) (A) 01/16/2024    Lab Results  Component Value Date   CREATININE 0.87 01/19/2023   CREATININE 0.74 07/29/2021   CREATININE 0.86 08/12/2020    Lab Results  Component Value Date   HGBA1C 5.3 07/02/2019     Lab Results  Component Value Date   HGB 14.9 01/19/2023     ASSESSMENT AND PLAN Ms. Lewinski is a 73 y.o. with:  1. Uterovaginal  prolapse, incomplete   2. SUI (stress urinary incontinence, female)   3. Urine frequency   4. Urine leukocytes   5. Hematuria, unspecified type     Uterovaginal prolapse, incomplete Assessment & Plan: Stage III anterior, Stage II posterior, Stage I apical prolapse - Previously noted by GYN to have stage IV prolapse (without pessary) - For treatment of pelvic organ prolapse, we discussed options for management including expectant management, conservative management, and surgical management, such as Kegels, a pessary, pelvic floor physical therapy, and specific surgical procedures. - We discussed two options for prolapse repair:  1) vaginal repair without mesh - Pros - safer, no mesh complications - Cons - not as strong as mesh repair, higher risk of recurrence  2) laparoscopic repair with mesh - Pros - stronger, better long-term success - Cons - risks of mesh implant (erosion into vagina or bladder, adhering to the rectum, pain) - these risks are lower than with a vaginal mesh but still exist - She is interested in surgery but not sure of the timing as her husband is currently undergoing cancer treatments. Provided her with handouts to review surgical options.    SUI (stress urinary incontinence, female) Assessment & Plan: - For treatment of stress urinary incontinence,  non-surgical options include expectant management, weight loss, physical therapy, as well as a pessary.  Surgical options include a midurethral sling, and transurethral injection of a bulking agent. - Recommended urodynamic testing. Once she has completed testing then we can review results and finalize surgical plan.    Urine frequency -     POCT URINALYSIS DIP (CLINITEK)  Urine leukocytes -     Urine Culture; Future  Hematuria, unspecified type -     Urine Microscopic;  Future  Return for Urodynamics   Arma Lamp, MD

## 2024-01-16 NOTE — Assessment & Plan Note (Signed)
-   For treatment of stress urinary incontinence,  non-surgical options include expectant management, weight loss, physical therapy, as well as a pessary.  Surgical options include a midurethral sling, and transurethral injection of a bulking agent. - Recommended urodynamic testing. Once she has completed testing then we can review results and finalize surgical plan.

## 2024-01-16 NOTE — Patient Instructions (Addendum)

## 2024-01-16 NOTE — Assessment & Plan Note (Addendum)
 Stage III anterior, Stage II posterior, Stage I apical prolapse - Previously noted by GYN to have stage IV prolapse (without pessary) - For treatment of pelvic organ prolapse, we discussed options for management including expectant management, conservative management, and surgical management, such as Kegels, a pessary, pelvic floor physical therapy, and specific surgical procedures. - We discussed two options for prolapse repair:  1) vaginal repair without mesh - Pros - safer, no mesh complications - Cons - not as strong as mesh repair, higher risk of recurrence  2) laparoscopic repair with mesh - Pros - stronger, better long-term success - Cons - risks of mesh implant (erosion into vagina or bladder, adhering to the rectum, pain) - these risks are lower than with a vaginal mesh but still exist - She is interested in surgery but not sure of the timing as her husband is currently undergoing cancer treatments. Provided her with handouts to review surgical options.

## 2024-01-17 LAB — URINE CULTURE: Culture: NO GROWTH

## 2024-01-17 LAB — CYTOLOGY - PAP
Diagnosis: NEGATIVE
Diagnosis: REACTIVE

## 2024-01-18 ENCOUNTER — Telehealth: Payer: Self-pay

## 2024-01-18 NOTE — Telephone Encounter (Signed)
 Pt calling to inquire if you desire her to fast for lipid panel to be drawn tomorrow?  Please advise.

## 2024-01-19 ENCOUNTER — Other Ambulatory Visit

## 2024-01-19 ENCOUNTER — Other Ambulatory Visit: Payer: Self-pay | Admitting: Obstetrics and Gynecology

## 2024-01-19 DIAGNOSIS — Z01419 Encounter for gynecological examination (general) (routine) without abnormal findings: Secondary | ICD-10-CM

## 2024-01-19 NOTE — Progress Notes (Signed)
 Patient presented for annual labs.

## 2024-01-19 NOTE — Telephone Encounter (Signed)
 Pt arrived for lab appt.  Encounter closed.

## 2024-01-22 ENCOUNTER — Other Ambulatory Visit: Payer: Self-pay | Admitting: Obstetrics and Gynecology

## 2024-01-22 DIAGNOSIS — Z01419 Encounter for gynecological examination (general) (routine) without abnormal findings: Secondary | ICD-10-CM

## 2024-01-23 ENCOUNTER — Telehealth: Payer: Self-pay

## 2024-01-23 NOTE — Telephone Encounter (Signed)
 Patient stated she is now using the pessary that she can take out herself. She will take her pessary out herself & will use the intravaginal monistat for 7 days. Patient to call if symptoms get worse or don't clear up.

## 2024-01-23 NOTE — Telephone Encounter (Addendum)
 Patient called & stated she saw Dr. Andrena Ke on 01-12-24 & she was treated with diflucan  for a yeast infection. Patient states she took her last pill on Thursday of last week & is still having the vaginal burning & itching. She states last time she had this happen to her, she was instructed to take her pessary out for a few days while she was treating. This time, she stated Dr. Frutoso Jing fitted her with a different pessary that would be harder for her to get out. She said it ended up flipping over & was causing some discomfort. She took it out & is now using the pessary that you had her using. She is not sure if removing it would help but she would like to know what to do because the symptoms are still there. Please advise.

## 2024-01-25 ENCOUNTER — Ambulatory Visit: Payer: Self-pay | Admitting: Obstetrics and Gynecology

## 2024-01-25 LAB — CBC
HCT: 45.7 % — ABNORMAL HIGH (ref 35.0–45.0)
Hemoglobin: 15.2 g/dL (ref 11.7–15.5)
MCH: 32.5 pg (ref 27.0–33.0)
MCHC: 33.3 g/dL (ref 32.0–36.0)
MCV: 97.9 fL (ref 80.0–100.0)
MPV: 10.6 fL (ref 7.5–12.5)
Platelets: 223 10*3/uL (ref 140–400)
RBC: 4.67 10*6/uL (ref 3.80–5.10)
RDW: 13 % (ref 11.0–15.0)
WBC: 4.3 10*3/uL (ref 3.8–10.8)

## 2024-01-25 LAB — COMPLETE METABOLIC PANEL WITHOUT GFR
AG Ratio: 1.9 (calc) (ref 1.0–2.5)
ALT: 20 U/L (ref 6–29)
AST: 20 U/L (ref 10–35)
Albumin: 4.4 g/dL (ref 3.6–5.1)
Alkaline phosphatase (APISO): 57 U/L (ref 37–153)
BUN: 17 mg/dL (ref 7–25)
CO2: 25 mmol/L (ref 20–32)
Calcium: 9.5 mg/dL (ref 8.6–10.4)
Chloride: 102 mmol/L (ref 98–110)
Creat: 0.79 mg/dL (ref 0.60–1.00)
Globulin: 2.3 g/dL (ref 1.9–3.7)
Glucose, Bld: 99 mg/dL (ref 65–99)
Potassium: 4.3 mmol/L (ref 3.5–5.3)
Sodium: 136 mmol/L (ref 135–146)
Total Bilirubin: 0.8 mg/dL (ref 0.2–1.2)
Total Protein: 6.7 g/dL (ref 6.1–8.1)

## 2024-01-25 LAB — LIPID PANEL
Cholesterol: 216 mg/dL — ABNORMAL HIGH (ref <200)
HDL: 79 mg/dL (ref >=50)
LDL Cholesterol (Calc): 114 mg/dL — ABNORMAL HIGH
Non-HDL Cholesterol (Calc): 137 mg/dL — ABNORMAL HIGH (ref <130)
Total CHOL/HDL Ratio: 2.7 (calc) (ref <5.0)
Triglycerides: 121 mg/dL (ref <150)

## 2024-01-25 LAB — THYROID PANEL WITH TSH
Free Thyroxine Index: 2.7 (ref 1.4–3.8)
T3 Uptake: 31 % (ref 22–35)
T4, Total: 8.7 ug/dL (ref 5.1–11.9)
TSH: 2.43 m[IU]/L (ref 0.40–4.50)

## 2024-01-25 LAB — VITAMIN D 25 HYDROXY (VIT D DEFICIENCY, FRACTURES): Vit D, 25-Hydroxy: 46 ng/mL (ref 30–100)

## 2024-01-25 LAB — HEMOGLOBIN A1C W/OUT EAG: Hgb A1c MFr Bld: 5.8 % — ABNORMAL HIGH (ref <5.7)

## 2024-01-26 ENCOUNTER — Telehealth: Payer: Self-pay | Admitting: *Deleted

## 2024-01-26 NOTE — Telephone Encounter (Signed)
 TC from pt. DOB verified.  Pt asked about urine micro and culture results.  Reviewed why the the UA micro was abnormal and advised urine culture negative.  Pt is not having symptoms. Terri Fester CMA

## 2024-01-31 ENCOUNTER — Other Ambulatory Visit (HOSPITAL_BASED_OUTPATIENT_CLINIC_OR_DEPARTMENT_OTHER): Payer: Self-pay | Admitting: Family Medicine

## 2024-01-31 DIAGNOSIS — E782 Mixed hyperlipidemia: Secondary | ICD-10-CM

## 2024-02-21 ENCOUNTER — Ambulatory Visit (HOSPITAL_BASED_OUTPATIENT_CLINIC_OR_DEPARTMENT_OTHER)
Admission: RE | Admit: 2024-02-21 | Discharge: 2024-02-21 | Disposition: A | Discharge status: Home / Self Care | Payer: Self-pay | Source: Ambulatory Visit | Attending: Family Medicine | Admitting: Family Medicine

## 2024-02-21 DIAGNOSIS — E782 Mixed hyperlipidemia: Secondary | ICD-10-CM | POA: Insufficient documentation

## 2024-03-19 ENCOUNTER — Ambulatory Visit (HOSPITAL_BASED_OUTPATIENT_CLINIC_OR_DEPARTMENT_OTHER)
Admission: RE | Admit: 2024-03-19 | Discharge: 2024-03-19 | Disposition: A | Discharge status: Home / Self Care | Source: Ambulatory Visit | Attending: Obstetrics and Gynecology | Admitting: Obstetrics and Gynecology

## 2024-03-19 DIAGNOSIS — M85852 Other specified disorders of bone density and structure, left thigh: Secondary | ICD-10-CM | POA: Diagnosis present

## 2024-03-29 ENCOUNTER — Other Ambulatory Visit: Payer: Self-pay

## 2024-03-29 DIAGNOSIS — N958 Other specified menopausal and perimenopausal disorders: Secondary | ICD-10-CM

## 2024-03-29 MED ORDER — ESTRADIOL 0.1 MG/GM VA CREA: TOPICAL_CREAM | VAGINAL | 1 refills | Status: AC

## 2024-03-29 NOTE — Telephone Encounter (Signed)
 Med refill request: Estradiol   Last AEX: 01/12/24 Next AEX: not scheduled  Last MMG (if hormonal med) 03/22/23 BIRADS Cat 1 neg  Refill authorized: Last rx 01/12/24 #42.5g with 1 refill. Please approve or deny

## 2024-04-05 ENCOUNTER — Telehealth: Payer: Self-pay | Admitting: *Deleted

## 2024-04-05 NOTE — Telephone Encounter (Signed)
 Spoke with patient. Patient is scheduled for pessary cleaning and reinsert on 04/18/24. Patient states she tried to remove the pessary on her own and is unsuccessful. States she was able to place it on her own. Feels like tissue is bulging over the edge of the pessary preventing her from releasing suction. Reports some irritation, is uncertain if this is yeast or from trying to remove pessary. States she is able to void with issues and denies any other GYN symptoms.   Reviewed common positions for removing pessary, patient is going to try removing in the bathtub, this is the only position she has not tried. She is requesting OV to be moved to earlier time. Unable to do mid day due to husbands cancer treatments.   Scheduled for 8/27 at 1600 with Dr. Dallie. Will f/u if any additional recommendations. Patient aware to call if any new symptoms or concerns.   Routing to Dr. Dallie.

## 2024-04-08 NOTE — Telephone Encounter (Signed)
 Aware.

## 2024-04-10 ENCOUNTER — Ambulatory Visit (INDEPENDENT_AMBULATORY_CARE_PROVIDER_SITE_OTHER): Admitting: Obstetrics and Gynecology

## 2024-04-10 VITALS — BP 112/64 | HR 72 | Temp 97.7°F | Wt 118.0 lb

## 2024-04-10 DIAGNOSIS — N958 Other specified menopausal and perimenopausal disorders: Secondary | ICD-10-CM

## 2024-04-10 DIAGNOSIS — N812 Incomplete uterovaginal prolapse: Secondary | ICD-10-CM | POA: Diagnosis not present

## 2024-04-10 DIAGNOSIS — B3731 Acute candidiasis of vulva and vagina: Secondary | ICD-10-CM

## 2024-04-10 DIAGNOSIS — N898 Other specified noninflammatory disorders of vagina: Secondary | ICD-10-CM

## 2024-04-10 LAB — WET PREP FOR TRICH, YEAST, CLUE

## 2024-04-10 MED ORDER — FLUCONAZOLE 150 MG PO TABS: 150.0000 mg | ORAL_TABLET | ORAL | 0 refills | Status: AC

## 2024-04-10 NOTE — Progress Notes (Signed)
 73 y.o. [id number] postmenopausal female with grade 4 cystocele (managed with #6 gellhorn-refitted 08/22/2023), recurrent yeast, GSM, osteopenia here for pessary maintenance. Married. Husband has brain cancer, may need additional surgery.    Prior pessary- Milex ring #6 with support was shifting  At last appt, she noted Vaginal odor and discharge with pessary. Urinary leakage with bending over with full bladder. Feels the pessary constricts her bowels. Had this symptoms with prior pessary, however she was able to remove prior pessary for bowel movements.  Today, she reports burning and vaginal itching x 2 weeks. Felt that pessary shifted, attempted to remove with no success.    Had appt with UROGYN- unable to have surgery at this time 2/2 husband's condition.  Also has questions about BMD and Vit K supplementation  Sexually active: yes   GYN HISTORY: No sig hx  OB History  Gravida Para Term Preterm AB Living  2 2 2   2   SAB IAB Ectopic Multiple Live Births      2    # Outcome Date GA Lbr Len/2nd Weight Sex Type Anes PTL Lv  2 Term     M Vag-Spont   LIV  1 Term     F Vag-Forceps   LIV   Past Medical History:  Diagnosis Date   Anxiety    Cataracts, bilateral    MD just watching   Heart murmur    Hx of migraine headaches    Past Surgical History:  Procedure Laterality Date   barholin gland excision     CATARACT EXTRACTION Bilateral    TUBAL LIGATION     WISDOM TOOTH EXTRACTION     Current Outpatient Medications on File Prior to Visit  Medication Sig Dispense Refill   Calcium Citrate-Vitamin D  (CALCIUM + D PO) Take by mouth.     CYCLOBENZAPRINE HCL PO Take by mouth as needed (migraine).     estradiol  (ESTRACE  VAGINAL) 0.1 MG/GM vaginal cream Apply 1/2 gram to vulva nightly for 2 weeks then decrease to 1/2 gram to vulva two nights a week. Do not use applicator. 42.5 g 1   hydrOXYzine  (ATARAX ) 10 MG tablet Take 1 tablet (10 mg total) by mouth 3 (three) times daily as needed.  30 tablet 1   Ibuprofen (ADVIL PO) Take by mouth.     MAGNESIUM PO Take by mouth.     Multiple Vitamin (MULTIVITAMIN PO) Take by mouth.     nystatin -triamcinolone  ointment (MYCOLOG) Apply 1 Application topically 2 (two) times daily. 30 g 3   No current facility-administered medications on file prior to visit.   No Known Allergies   PE Today's Vitals   04/10/24 1546  BP: 112/64  Pulse: 72  Temp: 97.7 F (36.5 C)  TempSrc: Oral  SpO2: 99%  Weight: 118 lb (53.5 kg)   Body mass index is 20.25 kg/m.  Physical Exam Vitals reviewed. Exam conducted with a chaperone present.  Constitutional:      General: She is not in acute distress.    Appearance: Normal appearance.  HENT:     Head: Normocephalic and atraumatic.     Nose: Nose normal.  Eyes:     Extraocular Movements: Extraocular movements intact.     Conjunctiva/sclera: Conjunctivae normal.  Pulmonary:     Effort: Pulmonary effort is normal.  Genitourinary:    General: Normal vulva.     Exam position: Lithotomy position.     Vagina: Prolapsed vaginal walls present. No vaginal discharge.     Uterus:  With uterine prolapse.      Comments: Stage 4 anterior and apical prolapse Musculoskeletal:        General: Normal range of motion.     Cervical back: Normal range of motion.  Neurological:     General: No focal deficit present.     Mental Status: She is alert.  Psychiatric:        Mood and Affect: Mood normal.        Behavior: Behavior normal.    Pessary Cleaning: Pessary was easily removed. Pelvic exam revealed no discharge, no blood, or ulcerations. Vaginal mucosa intact.  Pessary cleaned with antimicrobial soap and rinsed.  Pessary inserted in the vagina easily.   Assessment and Plan:        Cystocele with incomplete uterovaginal prolapse Uncomplicated pessary cleaning  Genitourinary syndrome of menopause Continue PV estrace   Yeast vaginisit Vaginal discharge -     WET PREP FOR TRICH, YEAST, CLUE - start  diflucan  150mg  PO q3d for 3 doses Declined suppressive therapy  Osteopenia Continue Vit D + calcium and mulivitamin  35 min  total time was spent for this patient encounter, including preparation, face-to-face counseling with the patient, coordination of care, and documentation of the encounter.  Vera LULLA Pa, MD

## 2024-04-18 ENCOUNTER — Ambulatory Visit: Admitting: Obstetrics and Gynecology

## 2024-04-24 ENCOUNTER — Other Ambulatory Visit: Payer: Self-pay

## 2024-04-24 MED ORDER — FLUCONAZOLE 150 MG PO TABS: 150.0000 mg | ORAL_TABLET | Freq: Once | ORAL | 2 refills | Status: AC

## 2024-04-24 NOTE — Telephone Encounter (Signed)
 Patient called and left message on triage voicemail. She states that she feels like she still has the yeast infection she was seen for. Paient is requesting a refill on fluconazole  sent to the pharmacy on file.

## 2024-04-30 NOTE — Telephone Encounter (Signed)
 Patient called & left a message on triage voicemail. She stated she called on Wednesday to get a refill of diflucan . She said she has not heard anything from our office. I notified her that the rx was sent to her pharmacy on Wednesday. She will go pick it up today.

## 2024-06-25 ENCOUNTER — Other Ambulatory Visit: Payer: Self-pay

## 2024-06-25 MED ORDER — FLUCONAZOLE 150 MG PO TABS: 150.0000 mg | ORAL_TABLET | Freq: Once | ORAL | 0 refills | Status: AC

## 2024-06-25 NOTE — Telephone Encounter (Signed)
 Patient called stating that she is having vaginal itching again. She states that she tired the Monistat one day but it did not help.

## 2024-07-16 ENCOUNTER — Ambulatory Visit: Admitting: Obstetrics and Gynecology
